# Patient Record
Sex: Female | Born: 1975 | Race: White | Hispanic: No | Marital: Single | Smoking: Former smoker
Health system: Southern US, Community
[De-identification: ages and names within clinical notes are randomized; demographics above are authoritative.]

## PROBLEM LIST (undated history)

## (undated) DIAGNOSIS — M65331 Trigger finger, right middle finger: Secondary | ICD-10-CM

## (undated) DIAGNOSIS — Z9889 Other specified postprocedural states: Secondary | ICD-10-CM

## (undated) DIAGNOSIS — Z8679 Personal history of other diseases of the circulatory system: Secondary | ICD-10-CM

## (undated) DIAGNOSIS — F909 Attention-deficit hyperactivity disorder, unspecified type: Secondary | ICD-10-CM

## (undated) DIAGNOSIS — R112 Nausea with vomiting, unspecified: Secondary | ICD-10-CM

## (undated) DIAGNOSIS — Z8614 Personal history of Methicillin resistant Staphylococcus aureus infection: Secondary | ICD-10-CM

## (undated) HISTORY — PX: BREAST REDUCTION SURGERY: SHX8

## (undated) HISTORY — PX: REFRACTIVE SURGERY: SHX103

---

## 1999-12-21 DIAGNOSIS — Z8679 Personal history of other diseases of the circulatory system: Secondary | ICD-10-CM

## 1999-12-21 HISTORY — PX: HEMATOMA EVACUATION: SHX5118

## 1999-12-21 HISTORY — DX: Personal history of other diseases of the circulatory system: Z86.79

## 2004-08-27 ENCOUNTER — Ambulatory Visit: Payer: Self-pay | Admitting: *Deleted

## 2004-08-27 ENCOUNTER — Ambulatory Visit: Payer: Self-pay | Admitting: Internal Medicine

## 2004-09-07 ENCOUNTER — Ambulatory Visit: Payer: Self-pay | Admitting: *Deleted

## 2005-11-05 ENCOUNTER — Ambulatory Visit: Payer: Self-pay | Admitting: Internal Medicine

## 2005-11-29 ENCOUNTER — Ambulatory Visit: Payer: Self-pay | Admitting: Internal Medicine

## 2006-01-28 ENCOUNTER — Ambulatory Visit: Payer: Self-pay | Admitting: Internal Medicine

## 2006-04-21 ENCOUNTER — Ambulatory Visit: Payer: Self-pay | Admitting: Internal Medicine

## 2006-11-25 ENCOUNTER — Ambulatory Visit: Payer: Self-pay | Admitting: Internal Medicine

## 2007-01-31 ENCOUNTER — Ambulatory Visit: Payer: Self-pay | Admitting: Internal Medicine

## 2007-02-14 ENCOUNTER — Encounter: Payer: Self-pay | Admitting: Internal Medicine

## 2007-02-14 ENCOUNTER — Ambulatory Visit: Payer: Self-pay | Admitting: Internal Medicine

## 2007-05-04 ENCOUNTER — Ambulatory Visit: Payer: Self-pay | Admitting: Internal Medicine

## 2007-08-25 ENCOUNTER — Ambulatory Visit: Payer: Self-pay | Admitting: Internal Medicine

## 2007-09-06 ENCOUNTER — Encounter (INDEPENDENT_AMBULATORY_CARE_PROVIDER_SITE_OTHER): Payer: Self-pay | Admitting: *Deleted

## 2008-01-16 ENCOUNTER — Ambulatory Visit: Payer: Self-pay | Admitting: Family Medicine

## 2008-01-16 DIAGNOSIS — I621 Nontraumatic extradural hemorrhage: Secondary | ICD-10-CM

## 2008-01-16 DIAGNOSIS — L732 Hidradenitis suppurativa: Secondary | ICD-10-CM

## 2008-01-16 DIAGNOSIS — B009 Herpesviral infection, unspecified: Secondary | ICD-10-CM | POA: Insufficient documentation

## 2008-01-16 DIAGNOSIS — Z9189 Other specified personal risk factors, not elsewhere classified: Secondary | ICD-10-CM | POA: Insufficient documentation

## 2008-02-01 ENCOUNTER — Telehealth (INDEPENDENT_AMBULATORY_CARE_PROVIDER_SITE_OTHER): Payer: Self-pay | Admitting: *Deleted

## 2008-03-07 ENCOUNTER — Encounter: Payer: Self-pay | Admitting: Family Medicine

## 2008-04-01 ENCOUNTER — Telehealth (INDEPENDENT_AMBULATORY_CARE_PROVIDER_SITE_OTHER): Payer: Self-pay | Admitting: *Deleted

## 2008-04-23 ENCOUNTER — Encounter: Payer: Self-pay | Admitting: Family Medicine

## 2008-05-24 ENCOUNTER — Telehealth (INDEPENDENT_AMBULATORY_CARE_PROVIDER_SITE_OTHER): Payer: Self-pay | Admitting: *Deleted

## 2008-06-11 ENCOUNTER — Telehealth (INDEPENDENT_AMBULATORY_CARE_PROVIDER_SITE_OTHER): Payer: Self-pay | Admitting: *Deleted

## 2008-06-12 ENCOUNTER — Ambulatory Visit: Payer: Self-pay | Admitting: Internal Medicine

## 2008-06-12 DIAGNOSIS — R071 Chest pain on breathing: Secondary | ICD-10-CM | POA: Insufficient documentation

## 2008-06-13 ENCOUNTER — Encounter (INDEPENDENT_AMBULATORY_CARE_PROVIDER_SITE_OTHER): Payer: Self-pay | Admitting: *Deleted

## 2008-06-24 ENCOUNTER — Telehealth (INDEPENDENT_AMBULATORY_CARE_PROVIDER_SITE_OTHER): Payer: Self-pay | Admitting: *Deleted

## 2008-07-18 ENCOUNTER — Telehealth (INDEPENDENT_AMBULATORY_CARE_PROVIDER_SITE_OTHER): Payer: Self-pay | Admitting: *Deleted

## 2008-08-13 ENCOUNTER — Ambulatory Visit: Payer: Self-pay | Admitting: Family Medicine

## 2008-08-13 DIAGNOSIS — H9190 Unspecified hearing loss, unspecified ear: Secondary | ICD-10-CM | POA: Insufficient documentation

## 2008-08-13 DIAGNOSIS — I8390 Asymptomatic varicose veins of unspecified lower extremity: Secondary | ICD-10-CM

## 2008-08-23 ENCOUNTER — Ambulatory Visit (HOSPITAL_COMMUNITY): Admission: RE | Admit: 2008-08-23 | Discharge: 2008-08-23 | Payer: Self-pay | Admitting: Family Medicine

## 2008-08-23 ENCOUNTER — Encounter: Payer: Self-pay | Admitting: Family Medicine

## 2008-08-30 ENCOUNTER — Telehealth (INDEPENDENT_AMBULATORY_CARE_PROVIDER_SITE_OTHER): Payer: Self-pay | Admitting: *Deleted

## 2008-09-12 ENCOUNTER — Encounter: Payer: Self-pay | Admitting: Family Medicine

## 2008-09-12 ENCOUNTER — Ambulatory Visit: Payer: Self-pay | Admitting: Family Medicine

## 2008-09-12 ENCOUNTER — Telehealth (INDEPENDENT_AMBULATORY_CARE_PROVIDER_SITE_OTHER): Payer: Self-pay | Admitting: *Deleted

## 2008-09-12 ENCOUNTER — Other Ambulatory Visit: Admission: RE | Admit: 2008-09-12 | Discharge: 2008-09-12 | Payer: Self-pay | Admitting: Family Medicine

## 2008-09-12 DIAGNOSIS — L089 Local infection of the skin and subcutaneous tissue, unspecified: Secondary | ICD-10-CM | POA: Insufficient documentation

## 2008-09-12 DIAGNOSIS — S90859A Superficial foreign body, unspecified foot, initial encounter: Secondary | ICD-10-CM

## 2008-09-12 LAB — HM PAP SMEAR

## 2008-09-18 ENCOUNTER — Encounter (INDEPENDENT_AMBULATORY_CARE_PROVIDER_SITE_OTHER): Payer: Self-pay | Admitting: *Deleted

## 2008-09-23 ENCOUNTER — Ambulatory Visit: Payer: Self-pay | Admitting: Vascular Surgery

## 2008-09-23 LAB — CONVERTED CEMR LAB
Albumin: 3.9 g/dL (ref 3.5–5.2)
BUN: 12 mg/dL (ref 6–23)
Basophils Absolute: 0 10*3/uL (ref 0.0–0.1)
Bilirubin, Direct: 0.2 mg/dL (ref 0.0–0.3)
Calcium: 9.6 mg/dL (ref 8.4–10.5)
Eosinophils Absolute: 0.3 10*3/uL (ref 0.0–0.7)
Eosinophils Relative: 3.9 % (ref 0.0–5.0)
HCT: 42.2 % (ref 36.0–46.0)
Hemoglobin: 14.3 g/dL (ref 12.0–15.0)
Monocytes Absolute: 0.8 10*3/uL (ref 0.1–1.0)
Monocytes Relative: 12.4 % — ABNORMAL HIGH (ref 3.0–12.0)
Neutro Abs: 4.1 10*3/uL (ref 1.4–7.7)
Neutrophils Relative %: 60.1 % (ref 43.0–77.0)
Platelets: 262 10*3/uL (ref 150–400)
Potassium: 4.1 meq/L (ref 3.5–5.1)
Sodium: 142 meq/L (ref 135–145)
Total CHOL/HDL Ratio: 2.6
Total Protein: 6.7 g/dL (ref 6.0–8.3)
VLDL: 8 mg/dL (ref 0–40)

## 2008-09-27 ENCOUNTER — Telehealth (INDEPENDENT_AMBULATORY_CARE_PROVIDER_SITE_OTHER): Payer: Self-pay | Admitting: *Deleted

## 2008-09-27 DIAGNOSIS — R87619 Unspecified abnormal cytological findings in specimens from cervix uteri: Secondary | ICD-10-CM

## 2008-10-01 ENCOUNTER — Encounter (INDEPENDENT_AMBULATORY_CARE_PROVIDER_SITE_OTHER): Payer: Self-pay | Admitting: *Deleted

## 2008-10-02 ENCOUNTER — Telehealth (INDEPENDENT_AMBULATORY_CARE_PROVIDER_SITE_OTHER): Payer: Self-pay | Admitting: *Deleted

## 2008-10-07 ENCOUNTER — Ambulatory Visit: Payer: Self-pay | Admitting: Family Medicine

## 2008-10-07 LAB — CONVERTED CEMR LAB: Beta hcg, urine, semiquantitative: NEGATIVE

## 2008-10-21 ENCOUNTER — Telehealth (INDEPENDENT_AMBULATORY_CARE_PROVIDER_SITE_OTHER): Payer: Self-pay | Admitting: *Deleted

## 2008-10-22 ENCOUNTER — Telehealth (INDEPENDENT_AMBULATORY_CARE_PROVIDER_SITE_OTHER): Payer: Self-pay | Admitting: *Deleted

## 2008-10-28 ENCOUNTER — Ambulatory Visit: Payer: Self-pay | Admitting: Vascular Surgery

## 2008-10-30 ENCOUNTER — Telehealth (INDEPENDENT_AMBULATORY_CARE_PROVIDER_SITE_OTHER): Payer: Self-pay | Admitting: *Deleted

## 2008-11-18 ENCOUNTER — Ambulatory Visit: Payer: Self-pay | Admitting: Vascular Surgery

## 2008-11-21 ENCOUNTER — Telehealth: Payer: Self-pay | Admitting: Family Medicine

## 2008-12-04 ENCOUNTER — Encounter: Payer: Self-pay | Admitting: Family Medicine

## 2008-12-17 ENCOUNTER — Telehealth (INDEPENDENT_AMBULATORY_CARE_PROVIDER_SITE_OTHER): Payer: Self-pay | Admitting: *Deleted

## 2009-01-06 ENCOUNTER — Ambulatory Visit: Payer: Self-pay | Admitting: Vascular Surgery

## 2009-01-06 HISTORY — PX: VARICOSE VEIN SURGERY: SHX832

## 2009-01-13 ENCOUNTER — Ambulatory Visit: Payer: Self-pay | Admitting: Vascular Surgery

## 2009-02-12 ENCOUNTER — Ambulatory Visit: Payer: Self-pay | Admitting: Family Medicine

## 2009-02-12 DIAGNOSIS — F411 Generalized anxiety disorder: Secondary | ICD-10-CM | POA: Insufficient documentation

## 2009-02-12 DIAGNOSIS — L708 Other acne: Secondary | ICD-10-CM

## 2009-02-12 DIAGNOSIS — F988 Other specified behavioral and emotional disorders with onset usually occurring in childhood and adolescence: Secondary | ICD-10-CM | POA: Insufficient documentation

## 2009-03-05 ENCOUNTER — Ambulatory Visit: Payer: Self-pay | Admitting: *Deleted

## 2009-03-12 ENCOUNTER — Encounter: Payer: Self-pay | Admitting: Family Medicine

## 2009-03-14 ENCOUNTER — Ambulatory Visit: Payer: Self-pay | Admitting: *Deleted

## 2009-03-25 ENCOUNTER — Telehealth (INDEPENDENT_AMBULATORY_CARE_PROVIDER_SITE_OTHER): Payer: Self-pay | Admitting: *Deleted

## 2009-03-28 ENCOUNTER — Telehealth (INDEPENDENT_AMBULATORY_CARE_PROVIDER_SITE_OTHER): Payer: Self-pay | Admitting: *Deleted

## 2009-04-03 ENCOUNTER — Telehealth (INDEPENDENT_AMBULATORY_CARE_PROVIDER_SITE_OTHER): Payer: Self-pay | Admitting: *Deleted

## 2009-05-22 ENCOUNTER — Telehealth (INDEPENDENT_AMBULATORY_CARE_PROVIDER_SITE_OTHER): Payer: Self-pay | Admitting: *Deleted

## 2009-05-29 ENCOUNTER — Telehealth (INDEPENDENT_AMBULATORY_CARE_PROVIDER_SITE_OTHER): Payer: Self-pay | Admitting: *Deleted

## 2009-07-01 ENCOUNTER — Telehealth (INDEPENDENT_AMBULATORY_CARE_PROVIDER_SITE_OTHER): Payer: Self-pay | Admitting: *Deleted

## 2009-08-27 ENCOUNTER — Telehealth (INDEPENDENT_AMBULATORY_CARE_PROVIDER_SITE_OTHER): Payer: Self-pay | Admitting: *Deleted

## 2009-09-15 ENCOUNTER — Ambulatory Visit: Payer: Self-pay | Admitting: Family Medicine

## 2009-09-16 ENCOUNTER — Ambulatory Visit: Payer: Self-pay | Admitting: Family Medicine

## 2009-09-17 ENCOUNTER — Encounter: Payer: Self-pay | Admitting: Family Medicine

## 2009-09-17 LAB — CONVERTED CEMR LAB
ALT: 14 units/L (ref 0–35)
AST: 19 units/L (ref 0–37)
Albumin: 3.5 g/dL (ref 3.5–5.2)
Alkaline Phosphatase: 43 units/L (ref 39–117)
Calcium: 8.7 mg/dL (ref 8.4–10.5)
Chloride: 111 meq/L (ref 96–112)
Cholesterol: 137 mg/dL (ref 0–200)
Creatinine, Ser: 0.8 mg/dL (ref 0.4–1.2)
Glucose, Bld: 96 mg/dL (ref 70–99)
HCT: 39.8 % (ref 36.0–46.0)
Lymphs Abs: 1.5 10*3/uL (ref 0.7–4.0)
MCV: 86.8 fL (ref 78.0–100.0)
Monocytes Absolute: 0.6 10*3/uL (ref 0.1–1.0)
Monocytes Relative: 12.5 % — ABNORMAL HIGH (ref 3.0–12.0)
Neutro Abs: 2.9 10*3/uL (ref 1.4–7.7)
Platelets: 248 10*3/uL (ref 150.0–400.0)
RDW: 11.4 % — ABNORMAL LOW (ref 11.5–14.6)
TSH: 1.38 microintl units/mL (ref 0.35–5.50)
Total CHOL/HDL Ratio: 3
Triglycerides: 90 mg/dL (ref 0.0–149.0)
WBC: 5.1 10*3/uL (ref 4.5–10.5)

## 2009-10-15 ENCOUNTER — Telehealth: Payer: Self-pay | Admitting: Family Medicine

## 2009-11-17 ENCOUNTER — Telehealth (INDEPENDENT_AMBULATORY_CARE_PROVIDER_SITE_OTHER): Payer: Self-pay | Admitting: *Deleted

## 2009-12-17 ENCOUNTER — Telehealth (INDEPENDENT_AMBULATORY_CARE_PROVIDER_SITE_OTHER): Payer: Self-pay | Admitting: *Deleted

## 2009-12-24 IMAGING — CR DG CHEST 2V
2 series · 2 of 2 positions shown · non-contrast
Comparison: None

CLINICAL DATA: Acute chest wall pain.

CHEST - 2 VIEW

[view not recorded (1 of 2)]
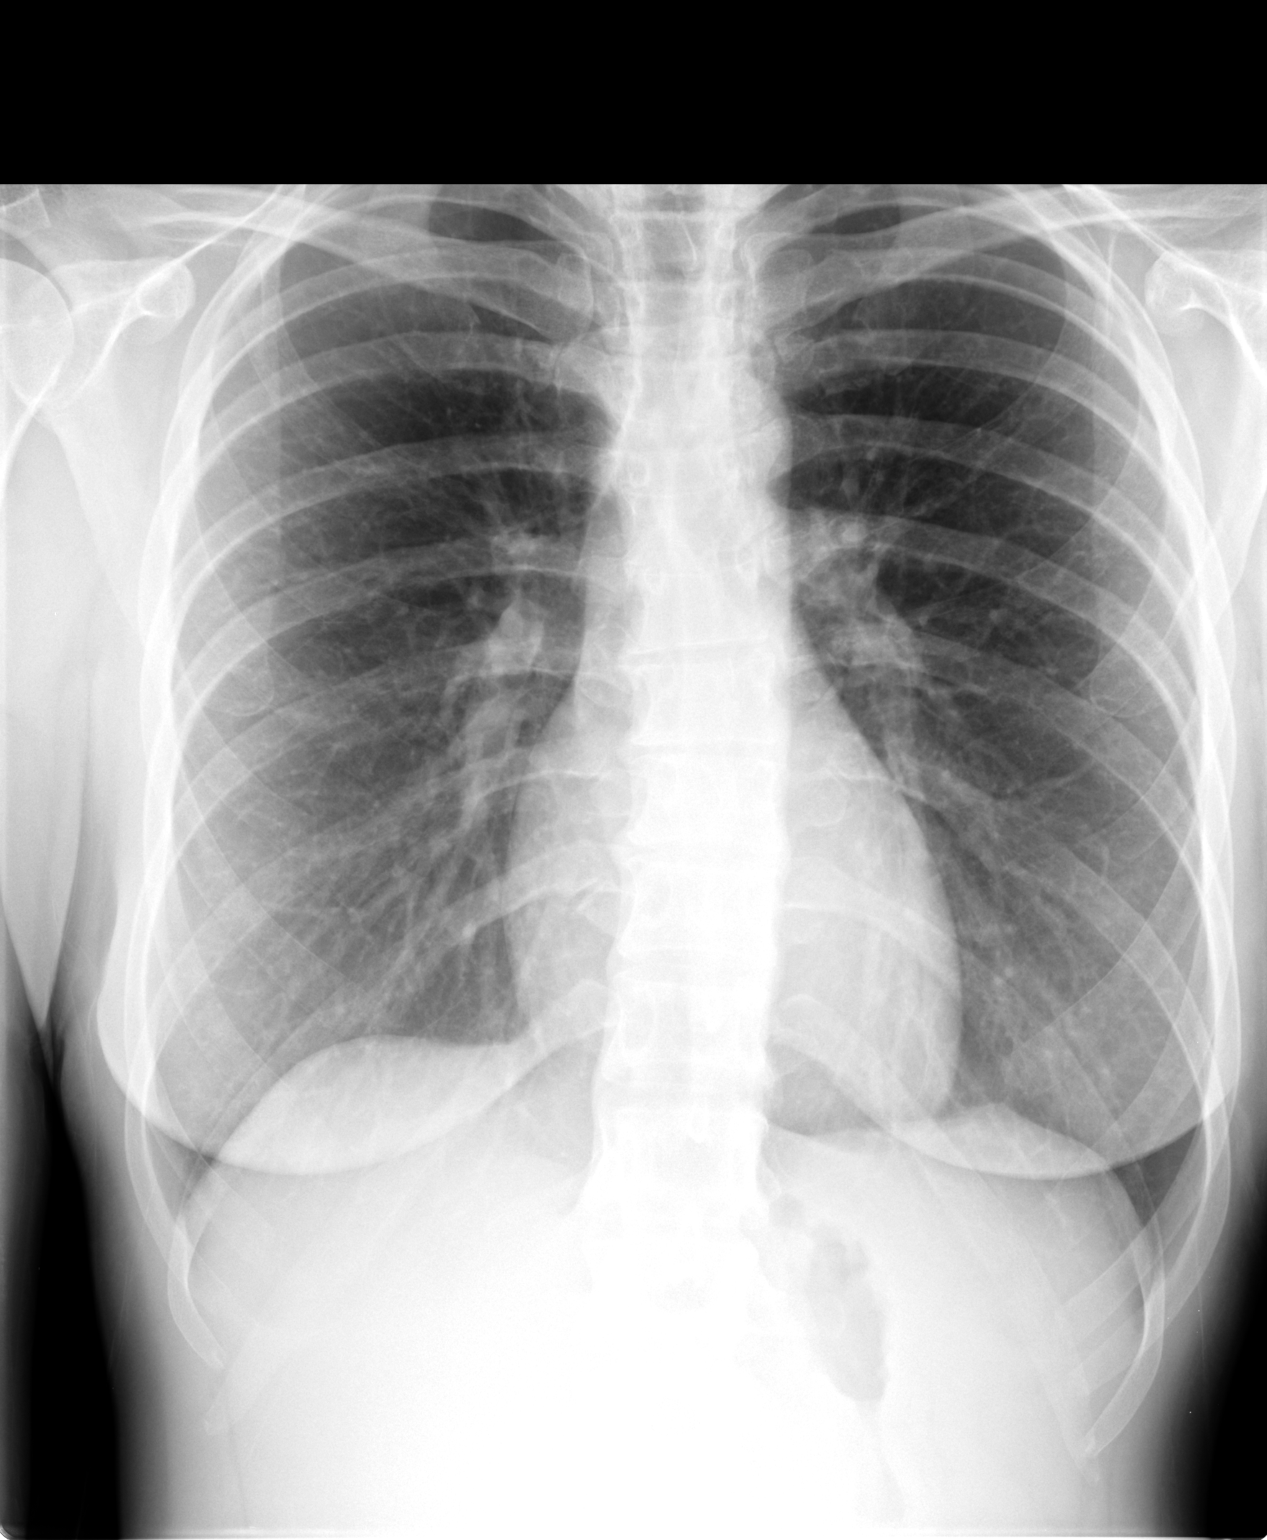

[view not recorded (2 of 2)]
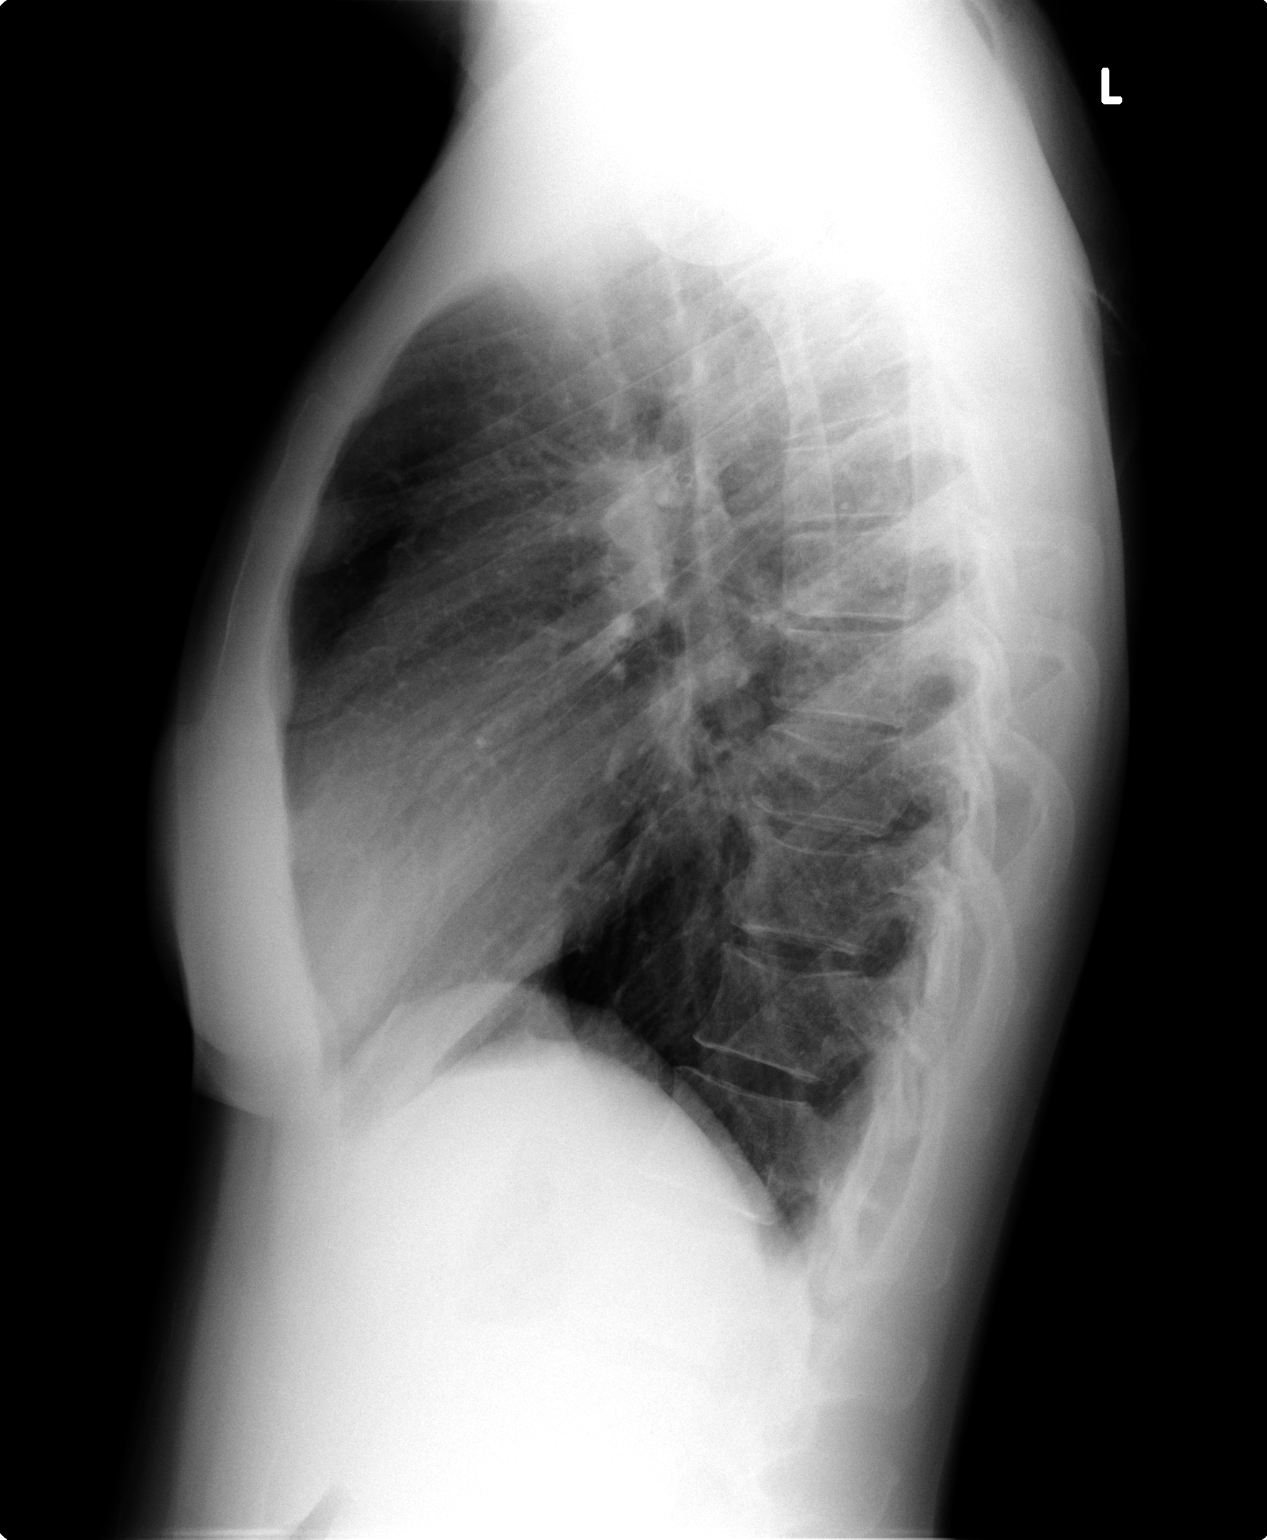

[2 of 2 positions shown; findings below may reference images not displayed]

FINDINGS: The cardiac silhouette, mediastinal and hilar contours
are within normal limits.  The lungs are clear.  The bony thorax is
intact.
IMPRESSION: 1.  No acute cardiopulmonary findings.

## 2009-12-26 ENCOUNTER — Ambulatory Visit (HOSPITAL_BASED_OUTPATIENT_CLINIC_OR_DEPARTMENT_OTHER): Admission: RE | Admit: 2009-12-26 | Discharge: 2009-12-26 | Payer: Self-pay | Admitting: Orthopedic Surgery

## 2009-12-26 HISTORY — PX: CARPAL TUNNEL RELEASE: SHX101

## 2010-01-23 ENCOUNTER — Telehealth (INDEPENDENT_AMBULATORY_CARE_PROVIDER_SITE_OTHER): Payer: Self-pay | Admitting: *Deleted

## 2010-02-20 ENCOUNTER — Telehealth (INDEPENDENT_AMBULATORY_CARE_PROVIDER_SITE_OTHER): Payer: Self-pay | Admitting: *Deleted

## 2010-03-24 ENCOUNTER — Telehealth (INDEPENDENT_AMBULATORY_CARE_PROVIDER_SITE_OTHER): Payer: Self-pay | Admitting: *Deleted

## 2010-04-27 ENCOUNTER — Ambulatory Visit: Payer: Self-pay | Admitting: Family Medicine

## 2010-06-05 ENCOUNTER — Telehealth (INDEPENDENT_AMBULATORY_CARE_PROVIDER_SITE_OTHER): Payer: Self-pay | Admitting: *Deleted

## 2010-06-30 ENCOUNTER — Ambulatory Visit: Payer: Self-pay | Admitting: Family Medicine

## 2010-06-30 DIAGNOSIS — R519 Headache, unspecified: Secondary | ICD-10-CM | POA: Insufficient documentation

## 2010-06-30 DIAGNOSIS — G2581 Restless legs syndrome: Secondary | ICD-10-CM

## 2010-06-30 DIAGNOSIS — R51 Headache: Secondary | ICD-10-CM

## 2010-07-13 ENCOUNTER — Telehealth (INDEPENDENT_AMBULATORY_CARE_PROVIDER_SITE_OTHER): Payer: Self-pay | Admitting: *Deleted

## 2010-09-14 ENCOUNTER — Telehealth (INDEPENDENT_AMBULATORY_CARE_PROVIDER_SITE_OTHER): Payer: Self-pay | Admitting: *Deleted

## 2010-09-30 ENCOUNTER — Ambulatory Visit: Payer: Self-pay | Admitting: Family Medicine

## 2010-09-30 LAB — CONVERTED CEMR LAB
Glucose, Urine, Semiquant: NEGATIVE
Ketones, urine, test strip: NEGATIVE
Specific Gravity, Urine: 1.01

## 2010-10-07 LAB — CONVERTED CEMR LAB
Albumin: 3.9 g/dL (ref 3.5–5.2)
Alkaline Phosphatase: 57 units/L (ref 39–117)
BUN: 11 mg/dL (ref 6–23)
Basophils Absolute: 0 10*3/uL (ref 0.0–0.1)
Bilirubin, Direct: 0.1 mg/dL (ref 0.0–0.3)
Calcium: 9.7 mg/dL (ref 8.4–10.5)
Creatinine, Ser: 0.8 mg/dL (ref 0.4–1.2)
Eosinophils Absolute: 0 10*3/uL (ref 0.0–0.7)
GFR calc non Af Amer: 83.61 mL/min (ref >60)
HCT: 42.1 % (ref 36.0–46.0)
HDL: 53.9 mg/dL (ref >39.00)
Hemoglobin: 14.1 g/dL (ref 12.0–15.0)
MCV: 85.6 fL (ref 78.0–100.0)
Monocytes Absolute: 0.6 10*3/uL (ref 0.1–1.0)
Monocytes Relative: 6.6 % (ref 3.0–12.0)
Neutrophils Relative %: 74.1 % (ref 43.0–77.0)
Total Bilirubin: 0.9 mg/dL (ref 0.3–1.2)
Total Protein: 6.7 g/dL (ref 6.0–8.3)
VLDL: 24.6 mg/dL (ref 0.0–40.0)

## 2010-10-20 ENCOUNTER — Telehealth: Payer: Self-pay | Admitting: Family Medicine

## 2010-10-22 ENCOUNTER — Telehealth (INDEPENDENT_AMBULATORY_CARE_PROVIDER_SITE_OTHER): Payer: Self-pay | Admitting: *Deleted

## 2010-12-10 ENCOUNTER — Ambulatory Visit: Payer: Self-pay | Admitting: Internal Medicine

## 2010-12-10 ENCOUNTER — Encounter (INDEPENDENT_AMBULATORY_CARE_PROVIDER_SITE_OTHER): Payer: Self-pay | Admitting: *Deleted

## 2010-12-10 DIAGNOSIS — J069 Acute upper respiratory infection, unspecified: Secondary | ICD-10-CM | POA: Insufficient documentation

## 2010-12-10 LAB — CONVERTED CEMR LAB: Rapid Strep: NEGATIVE

## 2010-12-17 ENCOUNTER — Telehealth: Payer: Self-pay | Admitting: Family Medicine

## 2010-12-20 DIAGNOSIS — Z8614 Personal history of Methicillin resistant Staphylococcus aureus infection: Secondary | ICD-10-CM

## 2010-12-20 HISTORY — DX: Personal history of Methicillin resistant Staphylococcus aureus infection: Z86.14

## 2010-12-24 ENCOUNTER — Telehealth: Payer: Self-pay | Admitting: Family Medicine

## 2011-01-08 ENCOUNTER — Ambulatory Visit: Admit: 2011-01-08 | Payer: Self-pay | Admitting: Family Medicine

## 2011-01-10 ENCOUNTER — Encounter: Payer: Self-pay | Admitting: Family Medicine

## 2011-01-17 LAB — CONVERTED CEMR LAB: Beta hcg, urine, semiquantitative: NEGATIVE

## 2011-01-20 NOTE — Progress Notes (Signed)
  Phone Note Refill Request   Refills Requested: Medication #1:  ADDERALL 20 MG  TABS bid  Follow-up for Phone Call        Pt is aware that rx is ready. Army Fossa CMA  January 23, 2010 10:14 AM     Prescriptions: ADDERALL 20 MG  TABS (AMPHETAMINE-DEXTROAMPHETAMINE) bid  #60 x 0   Entered by:   Army Fossa CMA   Authorized by:   Loreen Freud DO   Signed by:   Army Fossa CMA on 01/23/2010   Method used:   Print then Give to Patient   RxID:   [id number]

## 2011-01-20 NOTE — Assessment & Plan Note (Signed)
Summary: CPX AND PAP/CDJ   Vital Signs:  Patient profile:   35 year old female Height:      72 inches Weight:      172.6 pounds Temp:     98.8 degrees F oral Pulse rate:   72 / minute Pulse rhythm:   regular BP sitting:   116 / 76  (left arm) Cuff size:   regular  Vitals Entered By: Almeta Monas CMA Duncan Dull) (September 30, 2010 10:31 AM) CC: cpx/fasting no pap needed   History of Present Illness: Pt here for cpe ---no pap--- pt sees gyn.   No complaints.   Pt still having trouble with grinding teeth.  She is still having headaches but they are better.---pt will discuss with gyn because they come with cyle and 2 weeks after.     Preventive Screening-Counseling & Management  Alcohol-Tobacco     Alcohol drinks/day: <1     Smoking Status: never     Passive Smoke Exposure: no  Caffeine-Diet-Exercise     Caffeine use/day: 2     Does Patient Exercise: yes     Type of exercise: dancer, walking     Times/week: 4  Hep-HIV-STD-Contraception     Dental Visit-last 6 months yes     Dental Care Counseling: not indicated; dental care within six months     SBE monthly: yes  Safety-Violence-Falls     Seat Belt Use: 100      Sexual History:  currently monogamous.    Current Medications (verified): 1)  Acyclovir 200 Mg Caps (Acyclovir) .... Take 2 Capsules By Mouth Each Day For Herpes Prevention 2)  Adderall 20 Mg  Tabs (Amphetamine-Dextroamphetamine) .... Bid 3)  Clenia Foaming Wash 10-5 %  Emul (Sulfacetamide Sodium-Sulfur) .... Use Three Times Daily. 4)  Ortho Tri-Cyclen (28) 0.035 Mg Tabs (Norgestimate-Ethinyl Estradiol) .... As Directed 5)  Clenia 10-5 % Crea (Sulfacetamide Sodium-Sulfur) .... Use Three Times Daily 6)  Soma 250 Mg Tabs (Carisoprodol) .Marland Kitchen.. 1 By Mouth At Bedtime As Needed  Allergies (verified): 1)  ! Codeine  Past History:  Past Medical History: Last updated: 06/12/2008 G0 epidural hematoma post fall from balcony  Family History: Last updated:  01/16/2008 CAD - no HTN - F. family DM - F family stroke - no colon ca - no breast ca - no  Social History: Last updated: 01/16/2008 Single Never Smoked Alcohol use-no advanced home care-- resp care  Risk Factors: Alcohol Use: <1 (09/30/2010) Caffeine Use: 2 (09/30/2010) Exercise: yes (09/30/2010)  Risk Factors: Smoking Status: never (09/30/2010) Passive Smoke Exposure: no (09/30/2010)  Past Surgical History: breast reduction ; TM repair post perforation lasik surgery Carpal tunnel release (12/2009) Right--sypher  Family History: Reviewed history from 01/16/2008 and no changes required. CAD - no HTN - F. family DM - F family stroke - no colon ca - no breast ca - no  Social History: Reviewed history from 01/16/2008 and no changes required. Single Never Smoked Alcohol use-no advanced home care-- resp careSexual History:  currently monogamous  Review of Systems      See HPI General:  Denies chills, fatigue, fever, loss of appetite, malaise, sleep disorder, sweats, weakness, and weight loss. Eyes:  Denies blurring, discharge, double vision, eye irritation, eye pain, halos, itching, light sensitivity, red eye, vision loss-1 eye, and vision loss-both eyes; optho-q1y. ENT:  Denies decreased hearing, difficulty swallowing, ear discharge, earache, hoarseness, nasal congestion, nosebleeds, postnasal drainage, ringing in ears, sinus pressure, and sore throat. CV:  Denies bluish discoloration of lips  or nails, chest pain or discomfort, difficulty breathing at night, difficulty breathing while lying down, fainting, fatigue, leg cramps with exertion, lightheadness, near fainting, palpitations, shortness of breath with exertion, swelling of feet, swelling of hands, and weight gain. Resp:  Denies chest discomfort, chest pain with inspiration, cough, coughing up blood, excessive snoring, hypersomnolence, morning headaches, pleuritic, shortness of breath, sputum productive, and  wheezing. GI:  Denies abdominal pain, bloody stools, change in bowel habits, constipation, dark tarry stools, diarrhea, excessive appetite, gas, hemorrhoids, indigestion, loss of appetite, nausea, vomiting, vomiting blood, and yellowish skin color. GU:  Denies abnormal vaginal bleeding, decreased libido, discharge, dysuria, genital sores, hematuria, incontinence, nocturia, urinary frequency, and urinary hesitancy. MS:  Denies joint pain, joint redness, joint swelling, loss of strength, low back pain, mid back pain, muscle aches, muscle , cramps, muscle weakness, stiffness, and thoracic pain. Derm:  Denies changes in color of skin, changes in nail beds, dryness, excessive perspiration, flushing, hair loss, insect bite(s), itching, lesion(s), poor wound healing, and rash. Neuro:  Denies brief paralysis, difficulty with concentration, disturbances in coordination, falling down, headaches, inability to speak, memory loss, numbness, poor balance, seizures, sensation of room spinning, tingling, tremors, visual disturbances, and weakness. Psych:  Denies alternate hallucination ( auditory/visual), anxiety, depression, easily angered, easily tearful, irritability, mental problems, panic attacks, sense of great danger, suicidal thoughts/plans, thoughts of violence, unusual visions or sounds, and thoughts /plans of harming others. Endo:  Denies cold intolerance, excessive hunger, excessive thirst, excessive urination, heat intolerance, polyuria, and weight change. Heme:  Denies abnormal bruising, bleeding, enlarge lymph nodes, fevers, pallor, and skin discoloration. Allergy:  Denies hives or rash, itching eyes, persistent infections, seasonal allergies, and sneezing.  Physical Exam  General:  Well-developed,well-nourished,in no acute distress; alert,appropriate and cooperative throughout examination Head:  Normocephalic and atraumatic without obvious abnormalities. No apparent alopecia or balding. Eyes:  vision  grossly intact, pupils equal, pupils round, pupils reactive to light, and no injection.   Ears:  External ear exam shows no significant lesions or deformities.  Otoscopic examination reveals clear canals, tympanic membranes are intact bilaterally without bulging, retraction, inflammation or discharge. Hearing is grossly normal bilaterally. Nose:  External nasal examination shows no deformity or inflammation. Nasal mucosa are pink and moist without lesions or exudates. Mouth:  Oral mucosa and oropharynx without lesions or exudates.  Teeth in good repair. Neck:  No deformities, masses, or tenderness noted.no carotid bruits.   Breasts:  gyn Lungs:  Normal respiratory effort, chest expands symmetrically. Lungs are clear to auscultation, no crackles or wheezes. Heart:  normal rate and no murmur.   Abdomen:  Bowel sounds positive,abdomen soft and non-tender without masses, organomegaly or hernias noted. Genitalia:  gyn Msk:  normal ROM, no joint tenderness, no joint swelling, no joint warmth, no redness over joints, no joint deformities, no joint instability, and no crepitation.   Pulses:  R and L carotid,radial,femoral,dorsalis pedis and posterior tibial pulses are full and equal bilaterally Extremities:  No clubbing, cyanosis, edema, or deformity noted with normal full range of motion of all joints.   Neurologic:  No cranial nerve deficits noted. Station and gait are normal. Plantar reflexes are down-going bilaterally. DTRs are symmetrical throughout. Sensory, motor and coordinative functions appear intact. Skin:  Intact without suspicious lesions or rashes Cervical Nodes:  No lymphadenopathy noted Axillary Nodes:  No palpable lymphadenopathy Psych:  Cognition and judgment appear intact. Alert and cooperative with normal attention span and concentration. 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 Take 2 capsules by mouth each day for herpes prevention 2)  Adderall 20 Mg Tabs (Amphetamine-dextroamphetamine) .... Bid 3)  Clenia Foaming Wash 10-5 % Emul (Sulfacetamide sodium-sulfur) .... Use three times daily. 4)  Ortho Tri-cyclen (28) 0.035 Mg Tabs (Norgestimate-ethinyl estradiol) .... As directed 5)  Clenia 10-5 % Crea (Sulfacetamide sodium-sulfur) .... Use three times daily 6)  Soma 250 Mg Tabs (Carisoprodol) .Marland Kitchen.. 1 by mouth at bedtime as needed  Other Orders: Admin 1st Vaccine (91478) Flu Vaccine 84yrs + (29562) Flu Vaccine Consent Questions     Do you have a history of severe allergic reactions to this vaccine? no    Any prior history of allergic reactions to egg and/or gelatin? no    Do you have a sensitivity to the preservative Thimersol? no    Do you have a past history of Guillan-Barre Syndrome? no    Do you currently have an acute febrile illness? no    Have you ever had a severe reaction to latex? no    Vaccine information given and explained to patient? yes    Are you currently pregnant? no    Lot Number:[id number]   Exp Date:06/19/2011   Site Given  Left Deltoid IM  Other Orders: Admin 1st Vaccine (13086) Flu Vaccine 28yrs + (57846) Prescriptions: SOMA 250 MG TABS (CARISOPRODOL) 1 by mouth at bedtime as needed  #30 x 0   Entered and Authorized by:   Loreen Freud DO   Signed by:   Loreen Freud DO on 09/30/2010   Method used:   Electronically to        [facility] [address] (retail)       [phone]       [place], [place]  [address]       Ph: [phone]       Fax: [phone]   RxID:   [id number]  .lbflu  Last Flu Vaccine:  Fluvax Non-MCR (09/15/2009 8:24:12 AM) Flu Vaccine Result Date:  09/30/2010 Flu Vaccine Result:  given Flu Vaccine Next Due:  1 yr  Laboratory Results   Urine Tests   Date/Time Reported: September 30, 2010 11:42 AM   Routine Urinalysis   Color: yellow Appearance: Clear Glucose: negative   (Normal Range: Negative) Bilirubin: negative   (Normal Range: Negative) Ketone: negative   (Normal Range: Negative) Spec. Gravity: 1.010   (Normal Range: 1.003-1.035) Blood: negative   (Normal Range: Negative) pH: 5.0   (Normal Range: 5.0-8.0) Protein: negative   (Normal Range: Negative) Urobilinogen: negative   (Normal Range: 0-1) Nitrite: negative   (Normal Range: Negative) Leukocyte Esterace: negative   (Normal Range: Negative)    Comments: Floydene Flock  September 30, 2010 11:43 AM

## 2011-01-20 NOTE — Progress Notes (Signed)
Summary: adderall refill   Phone Note Refill Request Call back at Home Phone [phone]   Refills Requested: Medication #1:  ADDERALL 20 MG  TABS bid Initial call taken by: Doristine Devoid CMA,  September 14, 2010 2:05 PM  Follow-up for Phone Call        left message on machine prescription ready for pick up....Marland KitchenMarland KitchenDoristine Devoid CMA  September 14, 2010 2:07 PM     Prescriptions: ADDERALL 20 MG  TABS (AMPHETAMINE-DEXTROAMPHETAMINE) bid  #60 x 0   Entered by:   Doristine Devoid CMA   Authorized by:   Loreen Freud DO   Signed by:   Doristine Devoid CMA on 09/14/2010   Method used:   Print then Give to Patient   RxID:   [id number]

## 2011-01-20 NOTE — Progress Notes (Signed)
Summary: Refill Request(Non-Urgent)  Phone Note Refill Request Call back at Home Phone [phone] Message from:  Patient  Refills Requested: Medication #1:  ADDERALL 20 MG  TABS bid  Method Requested: Pick up at Office Initial call taken by: Shonna Chock,  June 05, 2010 3:32 PM  Follow-up for Phone Call        Pt is aware rx is ready. Army Fossa CMA  June 05, 2010 3:35 PM     Prescriptions: ADDERALL 20 MG  TABS (AMPHETAMINE-DEXTROAMPHETAMINE) bid  #60 x 0   Entered by:   Army Fossa CMA   Authorized by:   Loreen Freud DO   Signed by:   Army Fossa CMA on 06/05/2010   Method used:   Print then Give to Patient   RxID:   [id number]

## 2011-01-20 NOTE — Progress Notes (Signed)
  Phone Note Refill Request   Refills Requested: Medication #1:  ADDERALL 20 MG  TABS bid  Follow-up for Phone Call        done- pt aware. Army Fossa CMA  March 24, 2010 11:31 AM     Prescriptions: ADDERALL 20 MG  TABS (AMPHETAMINE-DEXTROAMPHETAMINE) bid  #60 x 0   Entered by:   Army Fossa CMA   Authorized by:   Loreen Freud DO   Signed by:   Army Fossa CMA on 03/24/2010   Method used:   Print then Give to Patient   RxID:   [id number]

## 2011-01-20 NOTE — Assessment & Plan Note (Signed)
Summary: Follow up on Adderall/drb   Vital Signs:  Patient profile:   35 year old female Weight:      170.38 pounds Pulse rate:   76 / minute Pulse rhythm:   regular BP sitting:   122 / 80  (left arm) Cuff size:   regular  Vitals Entered By: Army Fossa CMA (Apr 27, 2010 11:05 AM) CC: Pt here for follow up on adderall    History of Present Illness: Pt here for f/u add---Doing well with dose.  No complaints.    Current Medications (verified): 1)  Acyclovir 200 Mg Caps (Acyclovir) .... Take 2 Capsules By Mouth Each Day For Herpes Prevention 2)  Adderall 20 Mg  Tabs (Amphetamine-Dextroamphetamine) .... Bid 3)  Clenia Foaming Wash 10-5 %  Emul (Sulfacetamide Sodium-Sulfur) .... Use Three Times Daily. 4)  Selenium Sulfide 2.5 %  Lotn (Selenium Sulfide) .... Use As Directed 5)  Retin-A Micro 0.04 % Gel (Tretinoin Microsphere) .... Apply Qpm 6)  Ortho Tri-Cyclen (28) 0.035 Mg Tabs (Norgestimate-Ethinyl Estradiol) .... As Directed 7)  Clenia 10-5 % Crea (Sulfacetamide Sodium-Sulfur) .... Use Three Times Daily  Allergies: 1)  ! Codeine  Past History:  Past medical, surgical, family and social histories (including risk factors) reviewed for relevance to current acute and chronic problems.  Past Medical History: Reviewed history from 06/12/2008 and no changes required. G0 epidural hematoma post fall from balcony  Past Surgical History: Reviewed history from 09/12/2008 and no changes required. breast reduction ; TM repair post perforation lasik surgery  Family History: Reviewed history from 01/16/2008 and no changes required. CAD - no HTN - F. family DM - F family stroke - no colon ca - no breast ca - no  Social History: Reviewed history from 01/16/2008 and no changes required. Single Never Smoked Alcohol use-no advanced home care-- resp care  Review of Systems      See HPI  Physical Exam  General:  Well-developed,well-nourished,in no acute distress;  alert,appropriate and cooperative throughout examination Lungs:  Normal respiratory effort, chest expands symmetrically. Lungs are clear to auscultation, no crackles or wheezes. Heart:  normal rate and no murmur.   Psych:  Oriented X3, normally interactive, good eye contact, not anxious appearing, and not depressed appearing.     Impression & Recommendations:  Problem # 1:  ADD (ICD-314.00) refill adderall rto 6 months  Complete Medication List: 1)  Acyclovir 200 Mg Caps (Acyclovir) .... Take 2 capsules by mouth each day for herpes prevention 2)  Adderall 20 Mg Tabs (Amphetamine-dextroamphetamine) .... Bid 3)  Clenia Foaming Wash 10-5 % Emul (Sulfacetamide sodium-sulfur) .... Use three times daily. 4)  Selenium Sulfide 2.5 % Lotn (Selenium sulfide) .... Use as directed 5)  Retin-a Micro 0.04 % Gel (Tretinoin microsphere) .... Apply qpm 6)  Ortho Tri-cyclen (28) 0.035 Mg Tabs (Norgestimate-ethinyl estradiol) .... As directed 7)  Clenia 10-5 % Crea (Sulfacetamide sodium-sulfur) .... Use three times daily Prescriptions: ADDERALL 20 MG  TABS (AMPHETAMINE-DEXTROAMPHETAMINE) bid  #60 x 0   Entered and Authorized by:   Loreen Freud DO   Signed by:   Loreen Freud DO on 04/27/2010   Method used:   Print then Give to Patient   RxID:   [id number]

## 2011-01-20 NOTE — Progress Notes (Signed)
Summary: Adderall 11/2  Phone Note Refill Request Call back at Home Phone [phone] Message from:  Patient on October 20, 2010 11:00 AM  Refills Requested: Medication #1:  ADDERALL 20 MG  TABS bid Non-emergent, she has a few days left.  Initial call taken by: Lucious Groves CMA,  October 20, 2010 11:00 AM  Follow-up for Phone Call        Left message to call back due to Adderall not being available.      Almeta Monas CMA Duncan Dull)  October 20, 2010 2:45 PM  Left message to call back Almeta Monas CMA Duncan Dull)  October 21, 2010 9:43 AM   Additional Follow-up for Phone Call Additional follow up Details #1::        Pt called back advise pt of med not being available. Pt states that her pharmacy does have med will call them to confirm and if so we can print rx for her...........Marland KitchenFelecia Deloach CMA  October 21, 2010 11:31 AM     Additional Follow-up for Phone Call Additional follow up Details #2::    PT called back pharmacy doesnt have med in stock pt willing to try ritalin...............Marland KitchenFelecia Deloach CMA  October 21, 2010 3:25 PM  Dr.Lowne Please advise on the alternative for the Adderall 20mg .... Thank You    Additional Follow-up for Phone Call Additional follow up Details #3:: Details for Additional Follow-up Action Taken: Ritalin 10 mg two times a day #60  yrlowne 10/22/2010 845a  New/Updated Medications: RITALIN 10 MG TABS (METHYLPHENIDATE HCL) 1 by mouth two times a day Prescriptions: RITALIN 10 MG TABS (METHYLPHENIDATE HCL) 1 by mouth two times a day  #60 x 0   Entered by:   Almeta Monas CMA (AAMA)   Authorized by:   Loreen Freud DO   Signed by:   Almeta Monas CMA (AAMA) on 10/22/2010   Method used:   Print then Give to Patient   RxID:   [id number] ADDERALL 20 MG  TABS (AMPHETAMINE-DEXTROAMPHETAMINE) bid  #60 x 0   Entered by:   Lucious Groves CMA   Authorized by:   Loreen Freud DO   Signed by:   Lucious Groves CMA on 10/20/2010   Method used:   Print then  Give to Patient   RxID:   [id number]

## 2011-01-20 NOTE — Progress Notes (Signed)
Summary: refill  Phone Note Call from Patient Call back at Home Phone [phone]   Caller: Patient Summary of Call: Pt called back stating that she would like to have rx for adderall instead. Pt states that she has contacted pharmacy in [place] and they have med available there and all she need is her rx for med............Marland KitchenFelecia Deloach CMA  October 22, 2010 12:44 PM   Follow-up for Phone Call        Adderall Rx is already at check In, pt notified Rx ready for pickup.... Follow-up by: Almeta Monas CMA Duncan Dull),  October 22, 2010 1:36 PM     Appended Document: refill spk with Pharmacist who verified RX and stated they only had 49 pills available and pt agreed to taking them. Pt rcv'd 49 out of the 60 pills.

## 2011-01-20 NOTE — Assessment & Plan Note (Signed)
Summary: FREQUENT HEADACHES/KN   Vital Signs:  Patient profile:   35 year old female Height:      70.5 inches Weight:      171 pounds BMI:     24.28 Temp:     99.5 degrees F oral Pulse rate:   87 / minute BP sitting:   118 / 62  (left arm)  Vitals Entered By: Jeremy Johann CMA (June 30, 2010 3:04 PM) CC: frequent headache, discuss changing med Comments REVIEWED MED LIST, PATIENT AGREED DOSE AND INSTRUCTION CORRECT    History of Present Illness: Pt here c/o more frequent headaches over the last 3 years.   Pt is getting headaches 2 x a month and they last several hours if she doesn't take anything but she usually takes ibuprofen at the first sign and it usually takes care of it but not always.  She sometimes also wakes up with them at times.    Current Medications (verified): 1)  Acyclovir 200 Mg Caps (Acyclovir) .... Take 2 Capsules By Mouth Each Day For Herpes Prevention 2)  Adderall 20 Mg  Tabs (Amphetamine-Dextroamphetamine) .... Bid 3)  Clenia Foaming Wash 10-5 %  Emul (Sulfacetamide Sodium-Sulfur) .... Use Three Times Daily. 4)  Selenium Sulfide 2.5 %  Lotn (Selenium Sulfide) .... Use As Directed 5)  Retin-A Micro 0.04 % Gel (Tretinoin Microsphere) .... Apply Qpm 6)  Ortho Tri-Cyclen (28) 0.035 Mg Tabs (Norgestimate-Ethinyl Estradiol) .... As Directed 7)  Clenia 10-5 % Crea (Sulfacetamide Sodium-Sulfur) .... Use Three Times Daily  Allergies: 1)  ! Codeine  Past History:  Past medical, surgical, family and social histories (including risk factors) reviewed for relevance to current acute and chronic problems.  Past Medical History: Reviewed history from 06/12/2008 and no changes required. G0 epidural hematoma post fall from balcony  Past Surgical History: Reviewed history from 09/12/2008 and no changes required. breast reduction ; TM repair post perforation lasik surgery  Family History: Reviewed history from 01/16/2008 and no changes required. CAD - no HTN - F.  family DM - F family stroke - no colon ca - no breast ca - no  Social History: Reviewed history from 01/16/2008 and no changes required. Single Never Smoked Alcohol use-no advanced home care-- resp care  Review of Systems      See HPI  Physical Exam  General:  Well-developed,well-nourished,in no acute distress; alert,appropriate and cooperative throughout examination Eyes:  vision grossly intact, pupils equal, pupils round, pupils reactive to light, and no injection.   Ears:  External ear exam shows no significant lesions or deformities.  Otoscopic examination reveals clear canals, tympanic membranes are intact bilaterally without bulging, retraction, inflammation or discharge. Hearing is grossly normal bilaterally. Nose:  External nasal examination shows no deformity or inflammation. Nasal mucosa are pink and moist without lesions or exudates. Lungs:  Normal respiratory effort, chest expands symmetrically. Lungs are clear to auscultation, no crackles or wheezes. Msk:  No deformity or scoliosis noted of thoracic or lumbar spine.   Extremities:  No clubbing, cyanosis, edema, or deformity noted with normal full range of motion of all joints.   Neurologic:  No cranial nerve deficits noted. Station and gait are normal. Plantar reflexes are down-going bilaterally. DTRs are symmetrical throughout. Sensory, motor and coordinative functions appear intact. Psych:  Cognition and judgment appear intact. Alert and cooperative with normal attention span and concentration. No apparent delusions, illusions, hallucinations   Impression & Recommendations:  Problem # 1:  HEADACHE (ICD-784.0) Pt with hx head injury about  10 years ago  pt also grinds her teeth---get night guard Orders: Radiology Referral (Radiology)  Problem # 2:  ACNE VULGARIS (ICD-706.1) Assessment: Improved con't current bcp Her updated medication list for this problem includes:    Clenia Foaming Wash 10-5 % Emul  (Sulfacetamide sodium-sulfur) ..... Use three times daily.    Retin-a Micro 0.04 % Gel (Tretinoin microsphere) .Marland Kitchen... Apply qpm    Clenia 10-5 % Crea (Sulfacetamide sodium-sulfur) ..... Use three times daily  Discussed care of the skin and different treatment options.   Problem # 3:  RESTLESS LEGS SYNDROME (ICD-333.94)  calcium/mg vita D tonic water  Complete Medication List: 1)  Acyclovir 200 Mg Caps (Acyclovir) .... Take 2 capsules by mouth each day for herpes prevention 2)  Adderall 20 Mg Tabs (Amphetamine-dextroamphetamine) .... Bid 3)  Clenia Foaming Wash 10-5 % Emul (Sulfacetamide sodium-sulfur) .... Use three times daily. 4)  Selenium Sulfide 2.5 % Lotn (Selenium sulfide) .... Use as directed 5)  Retin-a Micro 0.04 % Gel (Tretinoin microsphere) .... Apply qpm 6)  Ortho Tri-cyclen (28) 0.035 Mg Tabs (Norgestimate-ethinyl estradiol) .... As directed 7)  Clenia 10-5 % Crea (Sulfacetamide sodium-sulfur) .... Use three times daily

## 2011-01-20 NOTE — Progress Notes (Signed)
Summary: adderall and selenium refill   Phone Note Refill Request Message from:  Patient on February 20, 2010 9:04 AM  Refills Requested: Medication #1:  ADDERALL 20 MG  TABS bid  Medication #2:  SELENIUM SULFIDE 2.5 %  LOTN use as directed Initial call taken by: Doristine Devoid,  February 20, 2010 9:06 AM  Follow-up for Phone Call        patient aware prescription ready to pick up and other prescription to be sent to [facility]Marland KitchenMarland KitchenDoristine Devoid  February 20, 2010 9:07 AM     Prescriptions: SELENIUM SULFIDE 2.5 %  LOTN (SELENIUM SULFIDE) use as directed  #4oz x 1   Entered by:   Doristine Devoid   Authorized by:   Marga Melnick MD   Signed by:   Doristine Devoid on 02/20/2010   Method used:   Electronically to        [facility]* (mail-order)             ,          Ph: [phone]       Fax: [phone]   RxID:   [id number] ADDERALL 20 MG  TABS (AMPHETAMINE-DEXTROAMPHETAMINE) bid  #60 x 0   Entered by:   Doristine Devoid   Authorized by:   Marga Melnick MD   Signed by:   Doristine Devoid on 02/20/2010   Method used:   Print then Give to Patient   RxID:   [id number]

## 2011-01-20 NOTE — Progress Notes (Signed)
Summary: Refill Request  Phone Note Refill Request Call back at Home Phone [phone] Message from:  Patient on July 13, 2010 11:17 AM  Refills Requested: Medication #1:  ADDERALL 20 MG  TABS bid   Dosage confirmed as above?Dosage Confirmed   Brand Name Necessary? No   Supply Requested: 1 month  Method Requested: Pick up at Office Next Appointment Scheduled: none Initial call taken by: Harold Barban,  July 13, 2010 11:17 AM Caller: Patient    Prescriptions: ADDERALL 20 MG  TABS (AMPHETAMINE-DEXTROAMPHETAMINE) bid  #60 x 0   Entered by:   Jeremy Johann CMA   Authorized by:   Loreen Freud DO   Signed by:   Jeremy Johann CMA on 07/13/2010   Method used:   Print then Give to Patient   RxID:   [id number]

## 2011-01-21 NOTE — Assessment & Plan Note (Signed)
Summary: sore throat/cbs   Vital Signs:  Patient profile:   35 year old female Weight:      182 pounds O2 Sat:      97 % on Room air Temp:     98.6 degrees F oral Pulse rate:   94 / minute Pulse rhythm:   regular BP sitting:   118 / 72  (left arm) Cuff size:   regular  Vitals Entered By: Army Fossa CMA (December 10, 2010 10:39 AM)  O2 Flow:  Room air CC: Pt here c/o sore throat started yesterday Comments [facility] [address]    History of Present Illness: developed a sore throat yesterday along with ear pressure and congestion bilaterally. She also had some head and facial congestion but less intense. Similar symptoms 10 days ago that self resolved. Several people in her office with strep  Current Medications (verified): 1)  Acyclovir 200 Mg Caps (Acyclovir) .... Take 2 Capsules By Mouth Each Day For Herpes Prevention 2)  Adderall 20 Mg  Tabs (Amphetamine-Dextroamphetamine) .... Bid 3)  Clenia Foaming Wash 10-5 %  Emul (Sulfacetamide Sodium-Sulfur) .... Use Three Times Daily. 4)  Ortho Tri-Cyclen (28) 0.035 Mg Tabs (Norgestimate-Ethinyl Estradiol) .... As Directed 5)  Clenia 10-5 % Crea (Sulfacetamide Sodium-Sulfur) .... Use Three Times Daily 6)  Ritalin 10 Mg Tabs (Methylphenidate Hcl) .Marland Kitchen.. 1 By Mouth Two Times A Day  Allergies (verified): 1)  ! Codeine  Past History:  Past Medical History: Reviewed history from 06/12/2008 and no changes required. G0 epidural hematoma post fall from balcony  Past Surgical History: Reviewed history from 09/30/2010 and no changes required. breast reduction ; TM repair post perforation lasik surgery Carpal tunnel release (12/2009) Right--sypher  Social History: Reviewed history from 01/16/2008 and no changes required. Single Never Smoked Alcohol use-no advanced home care-- resp care  Review of Systems General:  Denies fever. ENT:  some  PN drip. Resp:  Denies sputum productive; mild cough and chest congestion. GI:  Denies  vomiting; mild nausea.  Physical Exam  General:  alert and well-developed.   Head:  face symmetric, nontender to palpation Ears:  no redness or discharge Nose:  congestive Mouth:  tonsils not seen, no redness, no discharge, uvula midline Lungs:  normal respiratory effort, no intercostal retractions, no accessory muscle use, and normal breath sounds.     Impression & Recommendations:  Problem # 1:  URI (ICD-465.9)  see instructions   Orders: Rapid Strep (84696)  Complete Medication List: 1)  Acyclovir 200 Mg Caps (Acyclovir) .... Take 2 capsules by mouth each day for herpes prevention 2)  Adderall 20 Mg Tabs (Amphetamine-dextroamphetamine) .... Bid 3)  Clenia Foaming Wash 10-5 % Emul (Sulfacetamide sodium-sulfur) .... Use three times daily. 4)  Ortho Tri-cyclen (28) 0.035 Mg Tabs (Norgestimate-ethinyl estradiol) .... As directed 5)  Clenia 10-5 % Crea (Sulfacetamide sodium-sulfur) .... Use three times daily 6)  Ritalin 10 Mg Tabs (Methylphenidate hcl) .Marland Kitchen.. 1 by mouth two times a day 7)  Amoxicillin 500 Mg Tabs (Amoxicillin) .... 2 by mouth two times a day  Patient Instructions: 1)  rest, fluids, tylenol 2)  Sudafed behind the counter 30mg  4 times a day for congestion 3)  Robitussin DM twice a day for cough 4)  if no better in few days , start amoxicillin 5)  call if symptoms severe, high fever Prescriptions: AMOXICILLIN 500 MG TABS (AMOXICILLIN) 2 by mouth two times a day  #28 x 0   Entered and Authorized by:   Nolon Rod.  Paz MD   Signed by:   Nolon Rod. Paz MD on 12/10/2010   Method used:   Print then Give to Patient   RxID:   [id number]    Orders Added: 1)  Rapid Strep [87880] 2)  Est. Patient Level III [99213]    Laboratory Results    Other Tests  Rapid Strep: negative Comments: Army Fossa CMA  December 10, 2010 10:40 AM

## 2011-01-21 NOTE — Progress Notes (Signed)
Summary: Adderall RF  Phone Note Refill Request   Refills Requested: Medication #1:  ADDERALL 20 MG  TABS bid Per patient the pharmacy has the 10mg  Adderall and would rather take that because the Ritalin did not work c/b # [id number]...Marland Kitchen please advise   Method Requested: Pick up at Office Initial call taken by: Almeta Monas CMA Duncan Dull),  December 24, 2010 11:37 AM  Follow-up for Phone Call        adderall 10 mg 2 by mouth two times a day  Follow-up by: Loreen Freud DO,  December 24, 2010 1:01 PM  Additional Follow-up for Phone Call Additional follow up Details #1::        Vm left advising pt Rx ready for pick up.... Almeta Monas CMA (AAMA)  December 24, 2010 1:17 PM     New/Updated Medications: ADDERALL 10 MG TABS (AMPHETAMINE-DEXTROAMPHETAMINE) 2 by mouth two times a day Prescriptions: ADDERALL 10 MG TABS (AMPHETAMINE-DEXTROAMPHETAMINE) 2 by mouth two times a day  #120 x 0   Entered and Authorized by:   Loreen Freud DO   Signed by:   Loreen Freud DO on 12/24/2010   Method used:   Print then Give to Patient   RxID:   [id number]

## 2011-01-21 NOTE — Progress Notes (Signed)
Summary: refill  Phone Note Refill Request Call back at Home Phone [phone] Message from:  Patient  Refills Requested: Medication #1:  SOMA 250 MG TABS (CARISOPRODOL) 1 by mouth at bedtime as needed Pt states that she grinds her teeth real bad while sleeping. Pt notes that she is having a lot  of pain in jaw due to grinding. Pt would like to get refill of med. Pt uses [facility]. Pls advise................Marland KitchenFelecia Deloach CMA  December 17, 2010 11:42 AM     Follow-up for Phone Call        ok for #30, no refills Follow-up by: Neena Rhymes MD,  December 17, 2010 12:05 PM  Additional Follow-up for Phone Call Additional follow up Details #1::        Pt aware Rx sent to pharmacy.........Marland KitchenFelecia Deloach CMA  December 17, 2010 12:08 PM     New/Updated Medications: SOMA 250 MG TABS (CARISOPRODOL) Take 1 by mouth at bedtime as needed Prescriptions: SOMA 250 MG TABS (CARISOPRODOL) Take 1 by mouth at bedtime as needed  #30 x 0   Entered by:   Jeremy Johann CMA   Authorized by:   Neena Rhymes MD   Signed by:   Jeremy Johann CMA on 12/17/2010   Method used:   Faxed to ...       [facility] [address] (retail)       [phone]       [place], [place]  [address]       Ph: [phone]       Fax: [phone]   RxID:   [id number]

## 2011-01-21 NOTE — Letter (Signed)
Summary: Out of Work  [facility]  [address] [place], [place] [address]   Phone: [phone]  Fax: [phone]    December 10, 2010   Employee:  Zani Pestka    To Whom It May Concern:   For Medical reasons, please excuse the above named employee from work for the following dates:  Start:   December 10, 2010  End:   December 10, 2010   If you need additional information, please feel free to contact our office.         Sincerely,    Army Fossa CMA

## 2011-01-25 ENCOUNTER — Ambulatory Visit: Payer: Self-pay | Admitting: Family Medicine

## 2011-01-27 ENCOUNTER — Telehealth (INDEPENDENT_AMBULATORY_CARE_PROVIDER_SITE_OTHER): Payer: Self-pay | Admitting: *Deleted

## 2011-02-04 NOTE — Progress Notes (Signed)
Summary: refill  Phone Note Refill Request   Refills Requested: Medication #1:  ADDERALL 10 MG TABS 2 by mouth two times a day Pt aware Rx ready for [facility] tomorrow after 9am............Marland KitchenFelecia Deloach CMA  January 27, 2011 3:19 PM   Initial call taken by: Jeremy Johann CMA,  January 27, 2011 3:17 PM    Prescriptions: ADDERALL 10 MG TABS (AMPHETAMINE-DEXTROAMPHETAMINE) 2 by mouth two times a day  #120 x 0   Entered by:   Jeremy Johann CMA   Authorized by:   Loreen Freud DO   Signed by:   Jeremy Johann CMA on 01/27/2011   Method used:   Print then Give to Patient   RxID:   [id number]

## 2011-02-09 ENCOUNTER — Ambulatory Visit (INDEPENDENT_AMBULATORY_CARE_PROVIDER_SITE_OTHER): Payer: BC Managed Care – PPO

## 2011-02-09 ENCOUNTER — Encounter: Payer: Self-pay | Admitting: Family Medicine

## 2011-02-09 DIAGNOSIS — Z23 Encounter for immunization: Secondary | ICD-10-CM

## 2011-02-16 NOTE — Assessment & Plan Note (Signed)
Summary: tdap-booster with pertussis//fd  Nurse Visit   Allergies: 1)  ! Codeine  Immunizations Administered:  Tetanus Vaccine:    Vaccine Type: Tdap    Site: right deltoid    Mfr: GlaxoSmithKline    Dose: 0.5 ml    Route: IM    Given by: Jeremy Johann CMA    Exp. Date: 11/12/2012    Lot #: [id number]    VIS given: 11/06/08 version given February 09, 2011.  Orders Added: 1)  Tdap => 54yrs IM [90715] 2)  Admin 1st Vaccine [78295]

## 2011-02-24 ENCOUNTER — Telehealth: Payer: Self-pay | Admitting: Family Medicine

## 2011-03-02 ENCOUNTER — Ambulatory Visit (HOSPITAL_BASED_OUTPATIENT_CLINIC_OR_DEPARTMENT_OTHER)
Admission: RE | Admit: 2011-03-02 | Discharge: 2011-03-02 | Disposition: A | Discharge status: Home / Self Care | Payer: BC Managed Care – PPO | Source: Ambulatory Visit | Attending: Orthopedic Surgery | Admitting: Orthopedic Surgery

## 2011-03-02 DIAGNOSIS — G5600 Carpal tunnel syndrome, unspecified upper limb: Secondary | ICD-10-CM | POA: Insufficient documentation

## 2011-03-02 HISTORY — PX: CARPAL TUNNEL RELEASE: SHX101

## 2011-03-02 NOTE — Progress Notes (Signed)
Summary: refill  Phone Note Refill Request Call back at Home Phone [phone]   Refills Requested: Medication #1:  ADDERALL 10 MG TABS 2 by mouth two times a day Pt states that the pharmacy she uses does have 20 mg now and would like to change med back .Felecia Deloach CMA  February 24, 2011 2:13 PM    Follow-up for Phone Call        pt was taking Adderall 20mg   two times a day is it ok to switch back? Follow-up by: Almeta Monas CMA Duncan Dull),  February 24, 2011 3:20 PM  Additional Follow-up for Phone Call Additional follow up Details #1::        yes Additional Follow-up by: Loreen Freud DO,  February 24, 2011 3:29 PM    New/Updated Medications: ADDERALL 20 MG TABS (AMPHETAMINE-DEXTROAMPHETAMINE) 1 by mouth two times a day Prescriptions: ADDERALL 20 MG TABS (AMPHETAMINE-DEXTROAMPHETAMINE) 1 by mouth two times a day  #60 x 0   Entered by:   Almeta Monas CMA (AAMA)   Authorized by:   Loreen Freud DO   Signed by:   Almeta Monas CMA (AAMA) on 02/24/2011   Method used:   Print then Give to Patient   RxID:   [id number]

## 2011-03-03 LAB — POCT HEMOGLOBIN-HEMACUE: Hemoglobin: 14.1 g/dL (ref 12.0–15.0)

## 2011-03-04 NOTE — Op Note (Signed)
Joy [name]                ACCOUNT NO.:  [id number]  MEDICAL RECORD NO.:  [id number]          PATIENT TYPE:  LOCATION:                                 FACILITY:  PHYSICIAN:  Katy Fitch. Dailee Manalang, M.D.      DATE OF BIRTH:  DATE OF PROCEDURE:  03/02/2011 DATE OF DISCHARGE:                              OPERATIVE REPORT   PREOPERATIVE DIAGNOSIS:  Chronic entrapment neuropathy, left median nerve at carpal tunnel.  POSTOPERATIVE DIAGNOSIS:  Chronic entrapment neuropathy, left median nerve at carpal tunnel.  OPERATIONS:  Release of left transverse carpal ligament.  OPERATING SURGEON:  Katy Fitch. Stephen Baruch, MD  ASSISTANT:  Marveen Reeks Dasnoit, PA-C  ANESTHESIA:  General by LMA.  SUPERVISING ANESTHESIOLOGIST:  Kaylyn Layer. Michelle Piper, MD  INDICATIONS:  Joy [name] is a 35 year old woman who is employed by [employer] in [place].  She has had history of bilateral hand numbness dating back to 2009.  She had been evaluated at our office at that time with electrodiagnostic studies confirming bilateral carpal tunnel syndrome.  She is status post release of her right transverse carpal ligament in 2011 with a very satisfactory improvement in her sensibility.  She has had 2 steroid injections on the left without relief of her numbness.  Therefore, she presents for evaluation and management of carpal tunnel syndrome, anticipating release of her left transverse carpal ligament at this time.  Preoperatively, she was reminded of the potential risks and benefits of surgery.  She is noted to be allergic to LATEX and CODEINE.  Questions were invited and answered in detail.  PROCEDURE IN DETAIL:  Joy [name] was brought to room 6 of the [facility] and placed in supine position on the operating table. Following an anesthesia consult by Dr. Michelle Piper, general anesthesia by LMA technique was recommended and accepted.  Under Dr. Deirdre Priest direct supervision, general anesthesia was induced  by LMA technique followed by routine Betadine scrub of left upper extremity.  A pneumatic tourniquet was applied to her proximal brachium.  Following exsanguination of left arm with Esmarch bandage, arterial tourniquet was inflated to 220 mmHg. Procedure commenced with a routine surgical time-out.  A short incision was fashioned in the line of the ring finger palm.  Subcutaneous tissues were carefully divided in the palmar fascia.  This was split longitudinally to the common sensory branch of the median nerve.  These were followed back to the transverse carpal ligament that was subsequently separated from the median nerve proper with the aid of a Penfield 4 elevator.  Once the ligament was identified superficially and deep, ligaments released with scissors along its ulnar border extending into the distal forearm.  This widely opened the carpal canal.  The contents of carpal canal were inspected and found to be a remarkable for fibrotic tenosynovium.  Bleeding points were electrocauterized with bipolar current followed by repair of the skin with intradermal 3-0 Prolene suture.  A compressive dressing was applied with a volar plaster splint maintaining the wrist in 5 degrees of dorsiflexion.  For aftercare, Joy [name] will be provided a prescription for Dilaudid 2 mg one p.o.  q.4-6 h. p.r.n. pain, 20 tablets without refill.  We anticipate seeing her back for followup in our office in 1 week for suture removal and initiation of her postoperative rehabilitation program.     Katy Fitch. Joy [name], M.D.     RVS/MEDQ  D:  03/02/2011  T:  03/03/2011  Job:  [id number]  Electronically Signed by [name] M.D. on 03/04/2011 12:53:09 PM

## 2011-03-07 LAB — POCT HEMOGLOBIN-HEMACUE: Hemoglobin: 13.2 g/dL (ref 12.0–15.0)

## 2011-03-24 ENCOUNTER — Other Ambulatory Visit: Payer: Self-pay

## 2011-03-24 MED ORDER — AMPHETAMINE-DEXTROAMPHETAMINE 20 MG PO TABS: 20.0000 mg | ORAL_TABLET | Freq: Two times a day (BID) | ORAL | Status: DC

## 2011-03-24 NOTE — Telephone Encounter (Signed)
Pt aware Rx ready for pick up tomorrow    KP

## 2011-03-25 ENCOUNTER — Other Ambulatory Visit: Payer: Self-pay

## 2011-03-25 MED ORDER — AMPHETAMINE-DEXTROAMPHETAMINE 20 MG PO TABS: 20.0000 mg | ORAL_TABLET | Freq: Two times a day (BID) | ORAL | Status: DC

## 2011-04-22 ENCOUNTER — Other Ambulatory Visit: Payer: Self-pay | Admitting: *Deleted

## 2011-04-22 MED ORDER — AMPHETAMINE-DEXTROAMPHETAMINE 20 MG PO TABS: 20.0000 mg | ORAL_TABLET | Freq: Two times a day (BID) | ORAL | Status: DC

## 2011-04-22 NOTE — Telephone Encounter (Signed)
Pt aware Rx ready for pickup, 6 month f/u appt scheduled.

## 2011-04-26 ENCOUNTER — Encounter: Payer: Self-pay | Admitting: Family Medicine

## 2011-04-27 ENCOUNTER — Encounter: Payer: Self-pay | Admitting: Family Medicine

## 2011-04-27 ENCOUNTER — Ambulatory Visit (INDEPENDENT_AMBULATORY_CARE_PROVIDER_SITE_OTHER): Payer: BC Managed Care – PPO | Admitting: Family Medicine

## 2011-04-27 DIAGNOSIS — F988 Other specified behavioral and emotional disorders with onset usually occurring in childhood and adolescence: Secondary | ICD-10-CM

## 2011-04-27 DIAGNOSIS — R030 Elevated blood-pressure reading, without diagnosis of hypertension: Secondary | ICD-10-CM

## 2011-04-27 MED ORDER — AMPHETAMINE-DEXTROAMPHETAMINE 20 MG PO TABS: 20.0000 mg | ORAL_TABLET | Freq: Two times a day (BID) | ORAL | Status: DC

## 2011-04-27 NOTE — Assessment & Plan Note (Signed)
con't meds bp good today off bcp rto 3 months or sooner prn

## 2011-04-27 NOTE — Progress Notes (Signed)
  Subjective:    Patient ID: Joy [name], female    DOB: [birth year], 35 y.o.   MRN: [id number]  HPI  Pt here f/u adderall but is concerned because her bp ran high in gyn office.  Pt stopped bcp on her own because she knew it could elevate her bp.   She was not feeling well at the time.  No headaches, cp or sob.  Review of Systems As above    Objective:   Physical Exam  Constitutional: She is oriented to person, place, and time. She appears well-developed and well-nourished. No distress.  Neck: Normal range of motion. Neck supple.  Cardiovascular: Normal rate, regular rhythm and normal heart sounds.   Pulmonary/Chest: Effort normal and breath sounds normal.  Musculoskeletal: Normal range of motion.  Neurological: She is alert and oriented to person, place, and time.  Psychiatric: She has a normal mood and affect. Her behavior is normal. Judgment and thought content normal.          Assessment & Plan:

## 2011-04-27 NOTE — Patient Instructions (Signed)

## 2011-05-04 NOTE — Assessment & Plan Note (Signed)
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  These varicosities which are primarily in the lower thigh and calf area  cause aching, burning, and throbbing discomfort, which is increased by  being on her feet.  She works as a Horticulturist, commercial, and states that this is  affecting her ability to work because of the discomfort.  She has been  wearing long-leg graduated compression stockings since August of 2009,  which were recommended and prescribed by Dr. Laury Axon (20 mm - 30 mm long  leg) and has had no improvement in her symptoms.  She has also taken  ibuprofen, elevated the legs as much as possible with no success.  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   EXAMINATION:  Her right leg is unchanged with prominent varicosities  when she stands over the great saphenous system primarily below the  knee, extending down toward the ankle.  She has no hyperpigmentation,  ulceration, or distal edema at this time, but does have palpable distal  pulses.   I think she would benefit from laser ablation of her right greater  saphenous vein and multiple stab phlebectomies to improve or relieve her  symptoms, which are affecting her activities of daily living and ability  to work as a Horticulturist, commercial.  We will proceed with precertification for this  procedure.   [name], M.D.  Electronically Signed   JDL/MEDQ  D:  11/18/2008  T:  11/19/2008  Job:  [id number]

## 2011-05-04 NOTE — Procedures (Signed)
LOWER EXTREMITY VENOUS REFLUX EXAM   INDICATION:  Painful right lower extremity varicose veins.   EXAM:  Using color-flow imaging and pulse Doppler spectral analysis, the  right common femoral, superficial femoral, popliteal, posterior tibial,  greater and lesser saphenous veins are evaluated.  There is evidence  suggesting deep venous insufficiency in the right common femoral vein.   The right left saphenofemoral junction is not competent.  The right GSV  is not competent with the caliber as described below.   The right proximal short saphenous vein demonstrates competency.   GSV Diameter (used if found to be incompetent only)                                            Right    Left  Proximal Greater Saphenous Vein           1.0 cm   cm  Proximal-to-mid-thigh                     0.39 cm  cm  Mid thigh                                 0.37 cm  cm  Mid-distal thigh                          0.37 cm  cm  Distal thigh                              0.35 cm  cm  Knee                                      0.41 cm  cm   IMPRESSION:  1. 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  2. The right greater saphenous vein is not aneurysmal or tortuous.  3. The deep venous system is not competent at the right common femoral      vein level.  4. The right lesser saphenous vein is competent.   ___________________________________________  Quita Skye. Hart Rochester, M.D.   CH/MEDQ  D:  09/23/2008  T:  09/23/2008  Job:  [id number]

## 2011-05-04 NOTE — Assessment & Plan Note (Signed)
OFFICE VISIT   [name], Joy [name]  DOB:  [birth year]                                       01/13/2009  CHART#:[id number]   OFFICE VISIT:  The patient had stab phlebectomies of her right calf and  thigh 1 week ago for painful varicosities.  She did have reflux in her  great saphenous vein, but this vein was not very large and I was unable  to cannulate the vein, so no laser ablation performed.   On exam today she has no distal edema.  The stab phlebectomy wounds are  all healing nicely.  She has some moderate ecchymosis in the distal  thigh where the cannulation site was attempted.  She not having  significant pain.  Given her the okay to begin returning to her  exercises, advanced her and she will wear short-leg elastic compression  stockings.  She will return to see me 3 to 4 months for final check.   Joy [name], M.D.  Electronically Signed   JDL/MEDQ  D:  01/13/2009  T:  01/14/2009  Job:  [id number]

## 2011-05-04 NOTE — Assessment & Plan Note (Signed)
OFFICE VISIT   [name], Joy [name]  DOB:  09-28-1976                                       10/28/2008  CHART#:[id number]   I did not actually see her today, she is going to be returning at the  end of the month for evaluation, so she was a no-charge, she was not  seen.   [name], M.D.  Electronically Signed   JDL/MEDQ  D:  10/28/2008  T:  10/28/2008  Job:  [id number]

## 2011-05-04 NOTE — Consult Note (Signed)
VASCULAR SURGERY CONSULTATION   Joy [name], Joy [name]  DOB:  September 27, 1976                                       09/23/2008  CHART#:[id number]   This patient was referred for vascular surgery consultation by Dr. Laury Axon  for painful varicosities in the right lower extremity.  This healthy 35-  year-old female who works as a Geologist, engineering and also waits tables in  [employer] has been having increasing discomfort in her right leg from  painful varicosities over the last few years.  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  She saw Dr. Laury Axon in late August 2009 who  prescribed long-leg graduated compression stockings (20 mm - 30 mm) and  she has worn those for the past 6 weeks with no improvement in her  symptoms.  She does elevate her legs at night, but is unable to do this  during her work and dancing routines, and pain medication has not helped  her symptoms.  She has no history of deep venous thrombosis,  thrombophlebitis, bleeding, ulceration but does have increasing bulging  of the varicosities as the day progresses and some mild edema.   PAST MEDICAL HISTORY:  Negative for diabetes, hypertension, coronary  artery disease, hyperlipidemia, COPD or stroke.   PAST SURGICAL HISTORY:  1. Drainage of her epidural hematoma.  2. Breast reduction surgery.   FAMILY HISTORY:  Positive for varicose veins in her mother's side of the  family.  Positive for diabetes in a maternal aunt.  Negative for stroke  and coronary artery disease.   SOCIAL HISTORY:  She is single, works as a Passenger transport manager, and  also waits tables in [employer].  She does not use tobacco and has not  since 2004, and does not use alcohol.   REVIEW OF SYSTEMS:  Totally unremarkable with no significant  abnormalities in the review of systems.    MEDICATIONS:  Include Adderall and acyclovir.   ALLERGIES:  None known.   PHYSICAL EXAM:  Blood pressure is 132/86, heart rate 86, respirations  14.  General:  She is a healthy-appearing female in no apparent  distress, alert and oriented x3.  Neck:  Supple.  3+ carotid pulses  palpable.  No bruits are audible.  Neurologic:  Normal.  No palpable  adenopathy in the neck.  Chest:  Clear to auscultation.  Cardiovascular:  Regular rhythm.  No murmurs.  Abdomen:  Soft, nontender with no palpable  masses.  She has 3+ femoral, popliteal, and 2+ dorsalis pedis pulses  bilaterally.  Both feet are well perfused.  Right leg has bulging  varicosities over the greater saphenous system in the mid to distal  thigh and extending down into the medial calf in a typical location.  She has no hyperpigmentation or ulceration.  There is no evidence of  ischemia noted.   Venous duplex exam was performed in our office today and she has gross  reflux at the right saphenofemoral junction and in the right great  saphenous vein down to the knee level with a normal deep venous system.   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  We will treat her with a full 3 months of elastic compression stockings,  elevation, and analgesics, and have her return at that time to see if  she has had improvement in her symptoms.  If not, I think she would be a  good candidate for laser ablation of her right great saphenous vein with  multiple stab phlebectomies in the thigh and calf to relieve her  symptoms.   Joy [name], M.D.  Electronically Signed  JDL/MEDQ  D:  09/23/2008  T:  09/23/2008  Job:  [id number]   cc:   Lelon Perla, DO

## 2011-05-31 ENCOUNTER — Other Ambulatory Visit: Payer: Self-pay | Admitting: *Deleted

## 2011-05-31 DIAGNOSIS — F988 Other specified behavioral and emotional disorders with onset usually occurring in childhood and adolescence: Secondary | ICD-10-CM

## 2011-05-31 MED ORDER — AMPHETAMINE-DEXTROAMPHETAMINE 20 MG PO TABS: 20.0000 mg | ORAL_TABLET | Freq: Two times a day (BID) | ORAL | Status: DC

## 2011-05-31 NOTE — Telephone Encounter (Signed)
Pt aware Rx ready for pick up 

## 2011-07-07 ENCOUNTER — Other Ambulatory Visit: Payer: Self-pay | Admitting: *Deleted

## 2011-07-07 DIAGNOSIS — F988 Other specified behavioral and emotional disorders with onset usually occurring in childhood and adolescence: Secondary | ICD-10-CM

## 2011-07-07 NOTE — Telephone Encounter (Signed)
Please advise if it is ok to fill fr 90 days    KP

## 2011-07-08 MED ORDER — AMPHETAMINE-DEXTROAMPHETAMINE 20 MG PO TABS: 20.0000 mg | ORAL_TABLET | Freq: Two times a day (BID) | ORAL | Status: DC

## 2011-07-08 NOTE — Telephone Encounter (Signed)
Rx faxed.    KP 

## 2011-07-08 NOTE — Telephone Encounter (Signed)
Addended by: Candie Echevaria L on: 07/08/2011 12:49 PM   Modules accepted: Orders

## 2011-07-08 NOTE — Telephone Encounter (Signed)
Acyclovir Last filled 11-04-10 #180 2, last OV 04-27-11

## 2011-07-08 NOTE — Telephone Encounter (Signed)
Ok to fill for 90 days

## 2011-07-08 NOTE — Telephone Encounter (Signed)
Yes its ok

## 2011-07-28 ENCOUNTER — Ambulatory Visit: Payer: BC Managed Care – PPO | Admitting: Family Medicine

## 2011-08-02 MED ORDER — ACYCLOVIR 200 MG PO CAPS: ORAL_CAPSULE | ORAL | Status: DC

## 2011-08-02 NOTE — Telephone Encounter (Signed)
Addended by: Candie Echevaria L on: 08/02/2011 01:20 PM   Modules accepted: Orders

## 2011-08-02 NOTE — Telephone Encounter (Signed)
Rx faxed for acyclovir.

## 2011-08-09 ENCOUNTER — Ambulatory Visit (INDEPENDENT_AMBULATORY_CARE_PROVIDER_SITE_OTHER): Payer: BC Managed Care – PPO | Admitting: Family Medicine

## 2011-08-09 ENCOUNTER — Encounter: Payer: Self-pay | Admitting: Family Medicine

## 2011-08-09 VITALS — BP 116/82 | HR 69 | Temp 98.8°F | Wt 173.0 lb

## 2011-08-09 DIAGNOSIS — F988 Other specified behavioral and emotional disorders with onset usually occurring in childhood and adolescence: Secondary | ICD-10-CM

## 2011-08-09 DIAGNOSIS — Z7251 High risk heterosexual behavior: Secondary | ICD-10-CM

## 2011-08-09 DIAGNOSIS — F1210 Cannabis abuse, uncomplicated: Secondary | ICD-10-CM

## 2011-08-10 ENCOUNTER — Encounter: Payer: Self-pay | Admitting: Family Medicine

## 2011-08-10 DIAGNOSIS — F1210 Cannabis abuse, uncomplicated: Secondary | ICD-10-CM | POA: Insufficient documentation

## 2011-08-10 NOTE — Progress Notes (Signed)
  Subjective:    Patient ID: Joy [name], female    DOB: 1976-01-17, 35 y.o.   MRN: [id number]  HPI Pt here for add f/u.  Med working well.   Review of Systems No complaints    Objective:   Physical Exam  Constitutional: She is oriented to person, place, and time. She appears well-developed and well-nourished.  Cardiovascular: Normal rate, regular rhythm and normal heart sounds.   No murmur heard. Pulmonary/Chest: Effort normal and breath sounds normal. No respiratory distress. She has no wheezes. She has no rales.  Neurological: She is alert and oriented to person, place, and time.  Psychiatric: She has a normal mood and affect. Her behavior is normal.          Assessment & Plan:

## 2011-08-10 NOTE — Assessment & Plan Note (Signed)
con't meds rto 6 months 

## 2011-08-10 NOTE — Assessment & Plan Note (Signed)
Discussed risk with pt Recommended quitting

## 2011-09-22 ENCOUNTER — Encounter: Payer: Self-pay | Admitting: Vascular Surgery

## 2011-09-27 ENCOUNTER — Encounter: Payer: Self-pay | Admitting: Vascular Surgery

## 2011-09-28 ENCOUNTER — Ambulatory Visit (INDEPENDENT_AMBULATORY_CARE_PROVIDER_SITE_OTHER): Payer: BC Managed Care – PPO | Admitting: Vascular Surgery

## 2011-09-28 ENCOUNTER — Encounter: Payer: Self-pay | Admitting: Vascular Surgery

## 2011-09-28 ENCOUNTER — Other Ambulatory Visit (INDEPENDENT_AMBULATORY_CARE_PROVIDER_SITE_OTHER): Payer: BC Managed Care – PPO | Admitting: *Deleted

## 2011-09-28 VITALS — BP 112/70 | HR 73 | Resp 16 | Ht 70.0 in | Wt 174.0 lb

## 2011-09-28 DIAGNOSIS — I83893 Varicose veins of bilateral lower extremities with other complications: Secondary | ICD-10-CM

## 2011-09-28 NOTE — Progress Notes (Signed)
Subjective:     Patient ID: Joy [name], female   DOB: March 29, 1976, 35 y.o.   MRN: [id number]  HPI this healthy 35 year old female patient is to me having previously had multiple stab phlebectomy in January of 2010 for painful varicosities in the left leg. We attempted to treat the left great saphenous vein with laser ablation but were unable to cannulate the vein. She was asymptomatic following her stab phlebectomy procedure until a few months ago when she developed some pain in the right calf area. She has had no swelling, DVT, thrombophlebitis, stasis ulcers, or bleeding she does not wear elastic compression stockings the symptoms bother her the more she is up on her feet. She does not elevate her legs a regular basis nor take pain medicine.  Past Medical History  Diagnosis Date  . Epidural hematoma     post fall from balcony  . ADD (attention deficit disorder)   . Varicose veins     History  Substance Use Topics  . Smoking status: Former Smoker    Types: Cigarettes    Quit date: 12/20/2002  . Smokeless tobacco: Never Used  . Alcohol Use: No    Family History  Problem Relation Age of Onset  . Hypertension Father   . Diabetes Father   . Diabetes Maternal Aunt   . Other Other     varicose veins    Allergies  Allergen Reactions  . Codeine     REACTION: vomitting  . Latex Rash    Current outpatient prescriptions:acyclovir (ZOVIRAX) 200 MG capsule, Take 2 capsules by mouth each day for herpes prevention, Disp: 180 capsule, Rfl: 0;  amphetamine-dextroamphetamine (ADDERALL, 20MG ,) 20 MG tablet, Take 1 tablet (20 mg total) by mouth 2 (two) times daily., Disp: 180 tablet, Rfl: 0;  Biotin 1000 MCG tablet, Take 1,000 mcg by mouth daily.  , Disp: , Rfl:  calcium carbonate (OS-CAL - DOSED IN MG OF ELEMENTAL CALCIUM) 1250 MG tablet, Take 1 tablet by mouth daily.  , Disp: , Rfl: ;  glucosamine-chondroitin 500-400 MG tablet, Take 1 tablet by mouth 3 (three) times daily.  , Disp: , Rfl: ;   Multiple Vitamin (MULTIVITAMIN) tablet, Take 1 tablet by mouth daily.  , Disp: , Rfl: ;  Sulfacetamide Sodium-Sulfur (CLENIA FOAMING WASH) 10-5 % EMUL, Apply topically 3 (three) times daily. , Disp: , Rfl:   BP 112/70  Pulse 73  Resp 16  Ht 5\' 10"  (1.778 m)  Wt 174 lb (78.926 kg)  BMI 24.97 kg/m2  Body mass index is 24.97 kg/(m^2).         Review of Systems denies chest pain dyspnea on exertion PND orthopnea chronic cough and all symptoms. Complete negative review of systems    Objective:   Physical Exam blood pressure 112/70 heart rate 73 respirations 16 HEENT exam normal for age General she's a well-developed well-nourished female in no apparent distress alert and oriented x3 Chest no rhonchi or wheezing Cardiovascular regular rhythm no murmurs Abdomen soft nontender with no masses Her extremity exam reveals 3+ femoral popliteal and dorsalis pedis pulses palpable bilaterally excellent she has one prominent vein in the right leg beginning at the great saphenous vein just below the knee extending medially to the pretibial area. There is no hyperpigmentation or ulceration distally. No bulging varicosities are noted. There is no distal edema.   Today I ordered a venous duplex exam of the right leg. Reviewed and interpreted this. She has some reflux at the right saphenofemoral junction and  in the distal thigh at the knee level. The vein is small (great saphenous vein) there is no DVT.     Assessment:    recurrent prominent vein right calf appear off of the great saphenous system    Plan:     We'll treat this with sclerotherapy if she desires. No indication for laser ablation.

## 2011-09-30 ENCOUNTER — Ambulatory Visit: Payer: BC Managed Care – PPO | Admitting: Family Medicine

## 2011-10-01 ENCOUNTER — Ambulatory Visit (INDEPENDENT_AMBULATORY_CARE_PROVIDER_SITE_OTHER): Payer: BC Managed Care – PPO | Admitting: Family Medicine

## 2011-10-01 ENCOUNTER — Encounter: Payer: Self-pay | Admitting: Family Medicine

## 2011-10-01 ENCOUNTER — Other Ambulatory Visit: Payer: Self-pay | Admitting: Family Medicine

## 2011-10-01 DIAGNOSIS — L02519 Cutaneous abscess of unspecified hand: Secondary | ICD-10-CM

## 2011-10-01 MED ORDER — CEPHALEXIN 500 MG PO CAPS: 500.0000 mg | ORAL_CAPSULE | Freq: Four times a day (QID) | ORAL | Status: DC

## 2011-10-01 NOTE — Progress Notes (Signed)
  Subjective:    Patient ID: Joy [name], female    DOB: 20-Dec-1976, 35 y.o.   MRN: [id number]  HPI Pt here c/o bump on middle finger R hand.  It had gotten larger and drained pus yesterday.  Better today but tender.   No known bug bite etc.     Review of Systems    as directed Objective:   Physical Exam  Constitutional: She is oriented to person, place, and time. She appears well-developed and well-nourished.  Neurological: She is alert and oriented to person, place, and time.  Skin:       R mid finger--errythematous and swollen , tender to touch  Psychiatric: She has a normal mood and affect. Her behavior is normal.          Assessment & Plan:  Cellulitis finger---keflex qid for 10 days                        Culture done

## 2011-10-01 NOTE — Patient Instructions (Signed)
Cellulitis Cellulitis is an infection of the skin and the tissue beneath it. The area is typically red and tender. It is caused by germs (bacteria) (usually staph or strep) that enter the body through cuts or sores. Cellulitis most commonly occurs in the arms or lower legs.  HOME CARE INSTRUCTIONS  If you are given a prescription for medications which kill germs (antibiotics), take as directed until finished.   If the infection is on the arm or leg, keep the limb elevated as able.   Use a warm cloth several times per day to relieve pain and encourage healing.   See your caregiver for recheck of the infected site in a few days or sooner if problems arise.   Only take over-the-counter or prescription medicines for pain, discomfort, or fever as directed by your caregiver.  SEEK MEDICAL CARE IF:  An oral temperature above 100.4 develops, not controlled by medication.   The area of redness (inflammation) is spreading, there are red streaks coming from the infected site, or if a part of the infection begins to turn dark in color.   The joint or bone underneath the infected skin becomes painful after the skin has healed.   The infection returns in the same or another area after it seems to have gone away.   A boil or bump swells up. This may be an abscess.   New, unexplained problems such as pain or fever develop.  SEEK IMMEDIATE MEDICAL CARE IF:  You or your child feels drowsy or lethargic.   There is vomiting, diarrhea, or lasting discomfort or feeling ill (malaise) with muscle aches and pains.  MAKE SURE YOU:   Understand these instructions.   Will watch your condition.   Will get help right away if you are not doing well or get worse.  Document Released: 09/15/2005 Document Re-Released: 10/03/2009 [facility] Patient Information 2011 [place], [place].

## 2011-10-04 ENCOUNTER — Other Ambulatory Visit: Payer: Self-pay | Admitting: *Deleted

## 2011-10-04 MED ORDER — ACYCLOVIR 200 MG PO CAPS: ORAL_CAPSULE | ORAL | Status: DC

## 2011-10-04 NOTE — Telephone Encounter (Signed)
LAST OV 10-01-11, LAST FILLED 08-02-11 #180

## 2011-10-04 NOTE — Telephone Encounter (Signed)
Ok to refill x 1  

## 2011-10-04 NOTE — Telephone Encounter (Signed)
Rx sent to pharmacy   

## 2011-10-05 ENCOUNTER — Encounter: Payer: Self-pay | Admitting: Family Medicine

## 2011-10-05 ENCOUNTER — Ambulatory Visit (INDEPENDENT_AMBULATORY_CARE_PROVIDER_SITE_OTHER): Payer: BC Managed Care – PPO | Admitting: Family Medicine

## 2011-10-05 VITALS — BP 126/82 | HR 85 | Temp 98.2°F | Ht 72.0 in | Wt 178.4 lb

## 2011-10-05 DIAGNOSIS — Z22322 Carrier or suspected carrier of Methicillin resistant Staphylococcus aureus: Secondary | ICD-10-CM

## 2011-10-05 DIAGNOSIS — A4902 Methicillin resistant Staphylococcus aureus infection, unspecified site: Secondary | ICD-10-CM

## 2011-10-05 DIAGNOSIS — F988 Other specified behavioral and emotional disorders with onset usually occurring in childhood and adolescence: Secondary | ICD-10-CM

## 2011-10-05 LAB — WOUND CULTURE
Gram Stain: NONE SEEN
Gram Stain: NONE SEEN

## 2011-10-05 MED ORDER — SULFAMETHOXAZOLE-TRIMETHOPRIM 800-160 MG PO TABS: 1.0000 | ORAL_TABLET | Freq: Two times a day (BID) | ORAL | Status: DC

## 2011-10-05 MED ORDER — AMPHETAMINE-DEXTROAMPHETAMINE 20 MG PO TABS: 20.0000 mg | ORAL_TABLET | Freq: Two times a day (BID) | ORAL | Status: DC

## 2011-10-05 NOTE — Progress Notes (Signed)
  Subjective:    Patient ID: Joy [name], female    DOB: Nov 19, 1976, 35 y.o.   MRN: [id number]  HPI Pt here for f/u bp.  Her machine is measuring bp higher than ours.   We also received the culture from her wound on her finger and it is + MRSA.       Review of Systems As above    Objective:   Physical Exam  Constitutional: She is oriented to person, place, and time. She appears well-developed and well-nourished.  Cardiovascular: Normal rate and regular rhythm.   No murmur heard. Pulmonary/Chest: Effort normal and breath sounds normal. No respiratory distress. She has no wheezes. She has no rales.  Neurological: She is alert and oriented to person, place, and time.  Skin:       R mid finger---  Middle joint--+ swelling and errythema---better than last visit  Psychiatric: She has a normal mood and affect. Her behavior is normal. Judgment and thought content normal.          Assessment & Plan:  1  Elevated --bp---- resolved 2. MRSA--middle finger---change abx to bactrim  3. ADD---refill meds

## 2011-10-05 NOTE — Patient Instructions (Signed)
MRSA Overview MRSA stands for methicillin-resistant staphylococcus aureus. It is a type of bacteria that is resistant to some common antibiotics and can cause infections in the skin and many other places in the body. Staphylococcus aureus, often called "staph," are bacteria that normally live on the skin or in the nose. Staph on the surface of the skin do not cause problems. However, if the staph enter the body through a cut or a wound or another break in the skin, an infection can happen.   Up until recently, problems with the MRSA type of staph have mainly been in healthcare settings. There are now more and more problems with MRSA infections in the community. Infections with MRSA are serious health problems that can cause death.  Most MRSA infections are acquired in one of two ways:  Healthcare-Associated MRSA (HA-MRSA):   Can be acquired by people in any healthcare setting. MRSA can be a big problem for the elderly, people with weakened immune systems, dialysis patients (people undergoing blood cleansing treatment because of kidney failure), and those who have had surgery.   Community-Associated MRSA (CA-MRSA):   Community spread of MRSA is becoming more common. It is known to spread among small children, in crowded settings or in situations where there is close skin-to-skin contact. MRSA can be spread through shared items, such as children's toys, razors, towels or sports equipment.  CAUSES All staph, including MRSA, are normally harmless unless they enter the body through a scratch, cut or another wound such as with surgery. All staph, including MRSA, can be spread from person to person by touching contaminated objects as well as through direct contact. SPECIAL GROUPS MRSA can present problems for special groups of people. Some of these groups include:  Breastfeeding women.   The most common problem is CA-MRSA infection of the breast (mastitis). There is evidence that MRSA can be passed to  an infant from infected breast milk. Your caregiver may recommend that you stop breastfeeding until the mastitis is under control.   If you are breastfeeding and have a MRSA infection in a place other than the breast, you may usually continue breastfeeding while under treatment. If taking antibiotics, ask your caregiver if it is safe to continue breastfeeding while taking your prescribed medicines.   Neonates (babies from birth to one month old) and infants (babies from one month to one year old). There is evidence that MRSA can be passed to an infant at birth if the mother has a CA-MRSA on the skin, in or around the birth canal, or an infection in the uterus, cervix, or vagina. CA-MRSA infection can look the same as a normal newborn or infant rash or several other skin infections. This can make it hard to diagnose MRSA.   Immune compromised individuals. If you have an immune system problem, you may have a higher chance of developing an MRSA infection.   Persons after any type of surgery. Staph in general, including MRSA, are the most common cause of infections that occur at the site of recent surgery.   Persons on long-term steroid medications. These kinds of medications can lower your resistance to infection. This can increase the chances of getting MRSA.  DIAGNOSIS  Diagnosis of MRSA is done by cultures of fluid samples that may come from:   Swabs taken from cuts or wounds in infected areas.   Nasal swabs.   Saliva or deep cough specimens from the lungs (sputum).   Urine.   Blood.   Many people   are "colonized" with MRSA but have no signs of infection. This means that people carry the MRSA germ on their skin or in their nose and do not have and may never develop MRSA infection.  TREATMENT Treatment varies and is based on how serious, how deep or how extensive the infection process is. For example:  Some skin infections, such as a small boil or abscess, may be treated by draining pus  from the site of the infection.   Deeper or more widespread soft tissue infections are usually treated with surgery to drain pus and antibiotics given by vein or by mouth. This may be recommended even if you are pregnant.   Serious infections may require a hospital stay.   If antibiotics are given, they may be needed for several weeks.  PREVENTION Because many people are colonized with staph, including MRSA, preventing spread of the bacteria from person to person is most important. The best way to prevent the spread of bacteria and other germs is through proper hand washing or by using alcohol-based hand disinfectants. The following are other ways to help prevent MRSA infection within the hospital and community settings.   Healthcare Settings:   In health care settings, strict hand washing or hand disinfection procedures should be followed before and after touching every patient.   Patients infected with MRSA are placed in isolation to prevent the spread of the bacteria.   Health care workers will routinely wear disposable gowns and gloves when touching or caring for patients infected with MRSA.   Hospital surfaces are disinfected frequently.   Community Settings:   Wash your hands frequently with soap and water for at least 15 seconds. Otherwise, use alcohol based hand disinfectants when soap and water is not available.   Do not share personal items. For example, avoid sharing razors, towels, clothing and athletic equipment.   Keep wounds covered. Pus from infected sores may contain MRSA and other bacteria. Keep cuts and abrasions clean and covered with sterile, dry bandages until they are healed.   If you have a wound that appears infected, ask your doctor if a culture for MRSA and other bacteria should be done.   Take all antibiotic medication as prescribed by your caregiver. Even if you feel better, do not stop taking it.   If you are breastfeeding, talk to your caregiver about  MRSA. You may be asked to temporarily stop breastfeeding.  HOME CARE INSTRUCTIONS  Take all medications as prescribed. Do not skip medications or stop them early just because you seem to be doing better.   For those with MRSA infections, avoid close contact with those around you as much as possible. Do not use towels, razors, toothbrushes, or bedding etc. that will be used by others.   To fight the infection, follow your caregiver's instructions for wound care.  SEEK IMMEDIATE MEDICAL CARE IF:  The infection appears to be getting worse, such as:   Increased warmth, redness or tenderness around the wound site.   A red line develops and extends from the infection site.   The area around the infection begins to turn dark in color.   Wound drainage is tan, yellow or green in appearance.   Foul smell.   You develop nausea and vomiting or cannot keep medicine down.   You or your child has an unexplained oral temperature above 102.0 F (38.9 C). You or your child develop sweating, chills or body aches and pains.   Your baby is older than   3 months with a rectal temperature of 102 F (38.9 C) or higher.   Your baby is 3 months old or younger with a rectal temperature of 100.4 F (38 C) or higher.   You have difficulty breathing.  MAKE SURE YOU:   Understand these instructions.   Will watch your condition.   Will get help right away if you are not doing well or get worse.  Document Released: 12/06/2005 Document Re-Released: 03/02/2010 [employer] Patient Information 2011 [employer]. 

## 2011-10-06 NOTE — Procedures (Unsigned)
LOWER EXTREMITY VENOUS REFLUX EXAM  INDICATION:  Varicose veins.  EXAM:  Using color-flow imaging and pulse Doppler spectral analysis, the right common femoral, superficial femoral, popliteal, posterior tibial, greater and lesser saphenous veins are evaluated.  There is evidence of deep venous insufficiency >500 milliseconds noted focally in the right common femoral vein.  The right saphenofemoral junction demonstrates reflux of >567milliseconds. The right nontortuous GSV demonstrates reflux of >545milliseconds.  The right proximal small saphenous vein demonstrates competency.  GSV Diameter (used if found to be incompetent only)                                           Right    Left Proximal Greater Saphenous Vein           0.42 cm  cm Proximal-to-mid-thigh                     cm       cm Mid thigh                                 0.44 cm  cm Mid-distal thigh                          cm       cm Distal thigh                              0.42 cm  cm Knee                                      0.35 cm  cm  IMPRESSION:  Right greater saphenous and common femoral vein reflux is noted, as described above.  ___________________________________________ Quita Skye. Hart Rochester, M.D.  CH/MEDQ  D:  09/29/2011  T:  09/29/2011  Job:  [id number]

## 2011-10-07 ENCOUNTER — Encounter: Payer: Self-pay | Admitting: *Deleted

## 2011-10-07 ENCOUNTER — Telehealth: Payer: Self-pay | Admitting: *Deleted

## 2011-10-07 NOTE — Telephone Encounter (Signed)
Per Dr Laury Axon ok to provide note. Pt aware and note printed and placed up front for pick up.

## 2011-10-07 NOTE — Telephone Encounter (Signed)
I thought we gave her a note when she was here.

## 2011-10-07 NOTE — Telephone Encounter (Signed)
Pt left VM that Dr Laury Axon took her out of work for 48 due to her recent Dx of MRSA. Pt indicated that her job is now require a note to allow her to return to work. Pt note that she was advise that she was able to return as long as she kept area covered. .Please advise

## 2011-10-14 ENCOUNTER — Telehealth: Payer: Self-pay | Admitting: Family Medicine

## 2011-10-14 DIAGNOSIS — Z22322 Carrier or suspected carrier of Methicillin resistant Staphylococcus aureus: Secondary | ICD-10-CM

## 2011-10-14 MED ORDER — SULFAMETHOXAZOLE-TRIMETHOPRIM 800-160 MG PO TABS: 1.0000 | ORAL_TABLET | Freq: Two times a day (BID) | ORAL | Status: AC

## 2011-10-14 NOTE — Telephone Encounter (Signed)
Refill bactrim for 7 more days

## 2011-10-14 NOTE — Telephone Encounter (Signed)
Patient made aware Rx sent to the pharmacy     KP

## 2011-10-14 NOTE — Telephone Encounter (Signed)
Discussed with patient and she stated that her finger is better but the infection is not completely gone she is on her last dose of medication and she wanted to know what you recommend? Please advise   Joy [name]

## 2011-12-10 ENCOUNTER — Other Ambulatory Visit: Payer: Self-pay | Admitting: *Deleted

## 2011-12-10 DIAGNOSIS — F988 Other specified behavioral and emotional disorders with onset usually occurring in childhood and adolescence: Secondary | ICD-10-CM

## 2011-12-10 NOTE — Telephone Encounter (Signed)
Last OV 10-16 12 last refill 10-05-11 #180 no refills

## 2011-12-10 NOTE — Telephone Encounter (Signed)
Pt is 1 month early for refill.  None at this time.

## 2011-12-13 MED ORDER — AMPHETAMINE-DEXTROAMPHETAMINE 20 MG PO TABS: 20.0000 mg | ORAL_TABLET | Freq: Two times a day (BID) | ORAL | Status: DC

## 2011-12-13 NOTE — Telephone Encounter (Signed)
Last filled 10/05/11 and taking 20 mg twice a day. She stated she is over due. She stated she only got 60 tablets from  [facility] on [address]. Please advise     KP     Discussed with Kha at the pharmacy and he confirmed that only 60 was giving. Please advise     Kp

## 2011-12-13 NOTE — Telephone Encounter (Signed)
Patient aware Rx ready for pick up.      KP 

## 2011-12-13 NOTE — Telephone Encounter (Signed)
Ok for #60, no refills 

## 2011-12-29 ENCOUNTER — Ambulatory Visit: Payer: BC Managed Care – PPO | Admitting: *Deleted

## 2012-01-05 ENCOUNTER — Other Ambulatory Visit: Payer: Self-pay | Admitting: *Deleted

## 2012-01-06 MED ORDER — NORGESTIM-ETH ESTRAD TRIPHASIC 0.18/0.215/0.25 MG-35 MCG PO TABS: 1.0000 | ORAL_TABLET | Freq: Every day | ORAL | Status: DC

## 2012-01-06 NOTE — Telephone Encounter (Signed)
Discussed with patient and she stated she hs not missed any pills, and she is not having any issues. Needs refill on OCP. Pap is done with GYN and had been done 02/18/11.

## 2012-01-26 ENCOUNTER — Telehealth: Payer: Self-pay | Admitting: *Deleted

## 2012-01-26 MED ORDER — ACYCLOVIR 200 MG PO CAPS: ORAL_CAPSULE | ORAL | Status: DC

## 2012-01-26 NOTE — Telephone Encounter (Signed)
Rx sent 

## 2012-01-26 NOTE — Telephone Encounter (Signed)
Office Message from Date: 01/26/2012 12:00:00 AM Time of Call: [phone] Faxed To: [facility] (Daytime Triage) Caller: Elyzabeth Fax Number: [phone] Facility: N/A Patient: Joy [name], Joy [name] DOB: February 19, 1976 Phone: [phone] Provider: Lelon Perla Message: Patient calling refill acyclovir Pharmacy is [facility] [id number] Regarding Appointment: Appt Date: Appt Time: Unknown Provider: Reason: Details: Outcome:

## 2012-02-08 ENCOUNTER — Other Ambulatory Visit: Payer: Self-pay

## 2012-02-08 DIAGNOSIS — F988 Other specified behavioral and emotional disorders with onset usually occurring in childhood and adolescence: Secondary | ICD-10-CM

## 2012-02-08 MED ORDER — AMPHETAMINE-DEXTROAMPHETAMINE 20 MG PO TABS: 20.0000 mg | ORAL_TABLET | Freq: Two times a day (BID) | ORAL | Status: DC

## 2012-02-08 NOTE — Telephone Encounter (Signed)
Pt aware Rx ready for pick up.    KP 

## 2012-02-14 ENCOUNTER — Telehealth: Payer: Self-pay

## 2012-02-14 DIAGNOSIS — F988 Other specified behavioral and emotional disorders with onset usually occurring in childhood and adolescence: Secondary | ICD-10-CM

## 2012-02-14 NOTE — Telephone Encounter (Signed)
Msg from patient stating she misplaced her Adderall and would like to get another Rx. She stated she did not have a car and was getting rides from people and thinks she left it in someone car.  Please advise     KP

## 2012-02-14 NOTE — Telephone Encounter (Signed)
Pharmacy will not fill  rx if already filled--- until do for refill.

## 2012-02-15 MED ORDER — AMPHETAMINE-DEXTROAMPHETAMINE 20 MG PO TABS: 20.0000 mg | ORAL_TABLET | Freq: Two times a day (BID) | ORAL | Status: DC

## 2012-02-15 NOTE — Telephone Encounter (Signed)
Ok to refill med

## 2012-02-15 NOTE — Telephone Encounter (Signed)
Patient has not filled the Rx she lost the hard copy and stated she want to make you aware she is not doing anything illegal and she honestly lost the Rx. Please advise   KP

## 2012-02-15 NOTE — Telephone Encounter (Signed)
Rx printed.    KP 

## 2012-02-24 ENCOUNTER — Other Ambulatory Visit: Payer: Self-pay | Admitting: Obstetrics and Gynecology

## 2012-03-20 HISTORY — PX: VULVA SURGERY: SHX837

## 2012-04-03 ENCOUNTER — Other Ambulatory Visit: Payer: Self-pay | Admitting: Family Medicine

## 2012-04-03 NOTE — Telephone Encounter (Signed)
Refill Triphasic 0.18/0.215/0.25 MG-35  Called into [facility] on [address] ph# [phone]  Can call patient at [phone]

## 2012-04-05 MED ORDER — NORGESTIM-ETH ESTRAD TRIPHASIC 0.18/0.215/0.25 MG-35 MCG PO TABS: 1.0000 | ORAL_TABLET | Freq: Every day | ORAL | Status: DC

## 2012-04-05 NOTE — Telephone Encounter (Signed)
Rx sent 

## 2012-04-07 ENCOUNTER — Ambulatory Visit: Admit: 2012-04-07 | Payer: Self-pay | Admitting: Obstetrics and Gynecology

## 2012-04-07 SURGERY — LABIAPLASTY, VULVA
Anesthesia: Choice | Laterality: Bilateral

## 2012-04-26 ENCOUNTER — Telehealth: Payer: Self-pay | Admitting: *Deleted

## 2012-04-26 DIAGNOSIS — F988 Other specified behavioral and emotional disorders with onset usually occurring in childhood and adolescence: Secondary | ICD-10-CM

## 2012-04-26 MED ORDER — AMPHETAMINE-DEXTROAMPHETAMINE 20 MG PO TABS: 20.0000 mg | ORAL_TABLET | Freq: Two times a day (BID) | ORAL | Status: DC

## 2012-04-26 MED ORDER — NORGESTIM-ETH ESTRAD TRIPHASIC 0.18/0.215/0.25 MG-35 MCG PO TABS: 1.0000 | ORAL_TABLET | Freq: Every day | ORAL | Status: DC

## 2012-04-26 NOTE — Telephone Encounter (Signed)
Patient would like to speak w/Dr. Ernst Spell nurse concerning some new information regarding medication refills and also requesting refill on Adderall w/call back when ready/SLS

## 2012-04-26 NOTE — Telephone Encounter (Signed)
Patient aware Rx for birth control sent to [facility] and Adderall ready for pick up.     KP

## 2012-07-11 ENCOUNTER — Other Ambulatory Visit: Payer: Self-pay | Admitting: Family Medicine

## 2012-07-11 NOTE — Telephone Encounter (Signed)
Which pharmacy?

## 2012-07-11 NOTE — Telephone Encounter (Signed)
Patient called stated she will be moving to [place] next week and her medications are out of refills, she would like to get the following sent to a NEW pharmacy [facility] [address] before she leaves  1-Acyclovir (Cap) ZOVIRAX 200 MG Take 2 capsules by mouth each day for herpes prevention Last fill 2.6.13 Wants a 90-day supply   2-Norgestimate-Ethinyl Estradiol Triphasic 0.18/0.215/0.25 MG-35 MCG Take 1 tablet by mouth daily Last fill 5.8.13 Wants 30-day supply   Patient cb# [phone]

## 2012-07-12 NOTE — Telephone Encounter (Signed)
[facility] on [address]

## 2012-07-14 MED ORDER — ACYCLOVIR 200 MG PO CAPS: ORAL_CAPSULE | ORAL | Status: DC

## 2012-07-14 MED ORDER — NORGESTIM-ETH ESTRAD TRIPHASIC 0.18/0.215/0.25 MG-35 MCG PO TABS: 1.0000 | ORAL_TABLET | Freq: Every day | ORAL | Status: DC

## 2012-07-14 NOTE — Telephone Encounter (Signed)
Pt called again regarding her refills. She wants to them called into [facility] (not [facility]) on [address].

## 2012-10-05 ENCOUNTER — Other Ambulatory Visit: Payer: Self-pay | Admitting: Family Medicine

## 2012-10-05 DIAGNOSIS — F988 Other specified behavioral and emotional disorders with onset usually occurring in childhood and adolescence: Secondary | ICD-10-CM

## 2012-10-05 MED ORDER — AMPHETAMINE-DEXTROAMPHETAMINE 20 MG PO TABS: 20.0000 mg | ORAL_TABLET | Freq: Two times a day (BID) | ORAL | Status: DC

## 2012-10-05 NOTE — Telephone Encounter (Signed)
Discussed with patient and she voiced understanding and if she can not find a provider in her town she will call for a follow up for medication management.      KP

## 2012-10-05 NOTE — Telephone Encounter (Signed)
Pt called and has moved but has not been able to find new doctor yet. She is asking for a refill on her adderall. Pls call pt back

## 2012-10-05 NOTE — Telephone Encounter (Signed)
Ok for 1 month- no additional refills from this office

## 2012-10-05 NOTE — Telephone Encounter (Signed)
Patient has not been seen in 1 year. Please advise    KP 

## 2012-12-29 ENCOUNTER — Encounter: Payer: Self-pay | Admitting: *Deleted

## 2012-12-29 ENCOUNTER — Ambulatory Visit (INDEPENDENT_AMBULATORY_CARE_PROVIDER_SITE_OTHER): Payer: Self-pay | Admitting: Family Medicine

## 2012-12-29 ENCOUNTER — Encounter: Payer: Self-pay | Admitting: Family Medicine

## 2012-12-29 VITALS — BP 122/80 | HR 71 | Temp 98.6°F | Wt 195.4 lb

## 2012-12-29 DIAGNOSIS — B009 Herpesviral infection, unspecified: Secondary | ICD-10-CM

## 2012-12-29 DIAGNOSIS — F988 Other specified behavioral and emotional disorders with onset usually occurring in childhood and adolescence: Secondary | ICD-10-CM

## 2012-12-29 MED ORDER — AMPHETAMINE-DEXTROAMPHETAMINE 20 MG PO TABS: 20.0000 mg | ORAL_TABLET | Freq: Two times a day (BID) | ORAL | Status: DC

## 2012-12-29 MED ORDER — ACYCLOVIR 200 MG PO CAPS: ORAL_CAPSULE | ORAL | Status: AC

## 2012-12-29 NOTE — Patient Instructions (Addendum)
Amphetamine; Dextroamphetamine tablets What is this medicine? AMPHETAMINE; DEXTROAMPHETAMINE(am FET a meen; dex troe am FET a meen) is used to treat attention-deficit hyperactivity disorder (ADHD). It may also be used for narcolepsy. Federal law prohibits giving this medicine to any person other than the person for whom it was prescribed. Do not share this medicine with anyone else. This medicine may be used for other purposes; ask your health care provider or pharmacist if you have questions. What should I tell my health care provider before I take this medicine? They need to know if you have any of these conditions: -glaucoma -hardening or blockages of the arteries or heart blood vessels -heart disease or a heart defect -high blood pressure -history of alcohol or drug abuse -history of stroke -over-active thyroid gland -psychotic illness, depressed mood, or suicidal thoughts -recent weight loss -seizure disorder -Tourette's syndrome -an unusual or allergic reaction to dextroamphetamine, other amphetamines, other medicines, foods, dyes, or preservatives -pregnant or trying to get pregnant -breast-feeding How should I use this medicine? Take this medicine by mouth with a glass of water. Follow the directions on the prescription label. Take your doses at regular intervals. Do not take your medicine more often than directed. Do not suddenly stop your medicine. You must gradually reduce the dose or you may feel withdrawal effects. Ask your doctor or health care professional for advice. Talk to your pediatrician regarding the use of this medicine in children. Special care may be needed. While this drug may be prescribed for children as young as 3 years for selected conditions, precautions do apply. Overdosage: If you think you have taken too much of this medicine contact a poison control center or emergency room at once. NOTE: This medicine is only for you. Do not share this medicine with  others. What if I miss a dose? If you miss a dose, take it as soon as you can. If it is almost time for your next dose, take only that dose. Do not take double or extra doses. What may interact with this medicine? Do not take this medicine with any of the following medications: -alcohol -certain migraine headache medicines like almotriptan, eletriptan, frovatriptan, naratriptan, rizatriptan, sumatriptan, zolmitriptan -lithium -medicines called MAO inhibitors like Nardil, Parnate, Marplan, Eldepryl -medicines to decrease appetite or cause weight loss -melatonin -meperidine -other stimulant medications like dexmethylphenidate, methylphenidate, modafinil -pimozide -procarbazine This medicine may also interact with the following medications: -acetazolamide -ammonium chloride -ascorbic acid -glutamic acid -medicines for colds, sinus, and breathing difficulties -medicines for depression, anxiety, or psychotic disturbances -medicines for high blood pressure and heart medicines -methenamine -norepinephrine -propoxyphene -seizure (convulsion) or epilepsy medicine -sodium acid phosphate -sodium bicarbonate This list may not describe all possible interactions. Give your health care provider a list of all the medicines, herbs, non-prescription drugs, or dietary supplements you use. Also tell them if you smoke, drink alcohol, or use illegal drugs. Some items may interact with your medicine. What should I watch for while using this medicine? Visit your doctor or health care professional for regular checks on your progress. This prescription requires that you follow special procedures with your doctor and pharmacy. You will need to have a new written prescription from your doctor every time you need a refill. This medicine may affect your concentration, or hide signs of tiredness. Until you know how this medicine affects you, do not drive, ride a bicycle, use machinery, or do anything that needs  mental alertness. Tell your doctor or health care professional if this medicine  loses its effects, or if you feel you need to take more than the prescribed amount. Do not change the dosage without talking to your doctor or health care professional. Decreased appetite is a common side effect when starting this medicine. Eating small, frequent meals or snacks can help. Talk to your doctor if you continue to have poor eating habits. Height and weight growth of a child taking this medicine will be monitored closely. If you are going to have surgery or will need an x-ray procedure that uses contrast agents, tell your doctor or health care professional that you are taking this medicine. What side effects may I notice from receiving this medicine? Side effects that you should report to your doctor or health care professional as soon as possible: -allergic reactions like skin rash, itching or hives, swelling of the face, lips, or tongue -anxiety, nervousness -changes in mood or behavior -chest pain -fast, irregular heartbeat -fever, or hot, dry skin -high blood pressure -muscle twitching -uncontrollable head, mouth, neck, arm, or leg movements Side effects that usually do not require medical attention (report to your doctor or health care professional if they continue or are bothersome): -difficulty sleeping -dizziness or light headedness -headache -nausea, vomiting -stomach cramps -weight loss This list may not describe all possible side effects. Call your doctor for medical advice about side effects. You may report side effects to FDA at 1-800-FDA-1088. Where should I keep my medicine? Keep out of the reach of children. This medicine can be abused. Keep your medicine in a safe place to protect it from theft. Do not share this medicine with anyone. Selling or giving away this medicine is dangerous and against the law. Store at room temperature between 15 and 30 degrees C (59 and 86 degrees F). Keep  container tightly closed. Throw away any unused medicine after the expiration date. NOTE: This sheet is a summary. It may not cover all possible information. If you have questions about this medicine, talk to your doctor, pharmacist, or health care provider.  2013, [employer]. (02/12/2008 6:22:42 PM)

## 2012-12-29 NOTE — Assessment & Plan Note (Signed)
Refill valtrex 

## 2012-12-29 NOTE — Assessment & Plan Note (Signed)
Refill meds F/u new pcp in 6 months

## 2012-12-29 NOTE — Progress Notes (Signed)
  Subjective:    Patient ID: Joy [name], female    DOB: 07/25/1976, 37 y.o.   MRN: [id number]   HPI Pt is here to f/u ADD.  No complaints. Pt has moved to [place] and is looking for a dr there.     Review of Systems As above    Objective:   Physical Exam BP 122/80  Pulse 71  Temp 98.6 F (37 C) (Oral)  Wt 195 lb 6.4 oz (88.633 kg)  SpO2 97% General appearance: alert, cooperative, appears stated age and no distress Neck: no adenopathy, supple, symmetrical, trachea midline and thyroid not enlarged, symmetric, no tenderness/mass/nodules Lungs: clear to auscultation bilaterally Heart: S1, S2 normal Extremities: extremities normal, atraumatic, no cyanosis or edema       Assessment & Plan:

## 2013-02-05 ENCOUNTER — Encounter: Payer: Self-pay | Admitting: Family Medicine

## 2013-08-20 DIAGNOSIS — M65331 Trigger finger, right middle finger: Secondary | ICD-10-CM

## 2013-08-20 HISTORY — DX: Trigger finger, right middle finger: M65.331

## 2013-08-21 ENCOUNTER — Other Ambulatory Visit: Payer: Self-pay | Admitting: Orthopedic Surgery

## 2013-08-24 ENCOUNTER — Other Ambulatory Visit: Payer: Self-pay | Admitting: Orthopedic Surgery

## 2013-08-28 ENCOUNTER — Encounter (HOSPITAL_BASED_OUTPATIENT_CLINIC_OR_DEPARTMENT_OTHER): Payer: Self-pay | Admitting: *Deleted

## 2013-09-03 NOTE — H&P (Signed)
  Joy [name] is an 37 y.o. female.   Chief Complaint: c/o chronic and progressive STS symptoms right long finger HPI: Her main problem today is a sore right long finger with triggering in the morning and a 10 degree flexion contracture of the PIP joint. We had a detailed discussion regarding this predicament. In my judgement she would benefit from release of the A-1 pulley and limited synovectomy of her right long finger.  This can be accomplished under local anesthesia and sedation.     Past Medical History  Diagnosis Date  . PONV (postoperative nausea and vomiting)   . Personal history of epidural hemorrhage 2001    fell from [facility]  . ADHD (attention deficit hyperactivity disorder)   . History of MRSA infection 2012    right long finger  . History of varicose veins   . Trigger middle finger of right hand 08/2013    Past Surgical History  Procedure Laterality Date  . Breast reduction surgery    . Carpal tunnel release Right 12/26/2009  . Varicose vein surgery Right 01/06/09  . Refractive surgery Bilateral   . Carpal tunnel release Left 03/02/2011  . Hematoma evacuation  2001    epidural  . Vulva surgery  03/2012    labiaplasty    Family History  Problem Relation Age of Onset  . Hypertension Father   . Diabetes Father   . Diabetes Maternal Aunt   . Anesthesia problems Sister     post-op N/V  . Anesthesia problems Mother     post-op N/V   Social History:  reports that she quit smoking about 10 years ago. She has never used smokeless tobacco. She reports that  drinks alcohol. She reports that she uses illicit drugs (Marijuana).  Allergies:  Allergies  Allergen Reactions  . Codeine Nausea And Vomiting  . Latex Itching and Rash    No prescriptions prior to admission    No results found for this or any previous visit (from the past 48 hour(s)).  No results found.   Pertinent items are noted in HPI.  Height 5\' 11"  (1.803 m), weight 83.008 kg (183 lb),  last menstrual period 08/27/2013.  General appearance: alert Head: Normocephalic, without obvious abnormality Neck: supple, symmetrical, trachea midline Resp: clear to auscultation bilaterally Cardio: regular rate and rhythm GI: normal findings: bowel sounds normal Extremities:  Examination of the right hand reveals active triggering of the right long finger at the A-1 pulley level. There is palpable tendon nodule present.  Neurovascularly she is intact. She has full motion of her other digits without problem.   Pulses: 2+ and symmetric Skin: normal Neurologic: Grossly normal    Assessment/Plan Impression: Right long finger chronic STS  Plan: To the OR for release A-1 pulley right long finger and exam of PIP joint.The procedure, risks,benefits and post-op course were discussed with the patient at length and they were in agreement with the plan.  DASNOIT,Jerzey Komperda J 09/03/2013, 12:55 PM   H&P documentation: 09/04/2013  -History and Physical Reviewed  -Patient has been re-examined  -No change in the plan of care  Wyn Forster, MD

## 2013-09-04 ENCOUNTER — Encounter (HOSPITAL_BASED_OUTPATIENT_CLINIC_OR_DEPARTMENT_OTHER): Payer: Self-pay | Admitting: *Deleted

## 2013-09-04 ENCOUNTER — Encounter (HOSPITAL_BASED_OUTPATIENT_CLINIC_OR_DEPARTMENT_OTHER)
Admission: RE | Disposition: A | Discharge status: Home / Self Care | Payer: Self-pay | Source: Ambulatory Visit | Attending: Orthopedic Surgery

## 2013-09-04 ENCOUNTER — Ambulatory Visit (HOSPITAL_BASED_OUTPATIENT_CLINIC_OR_DEPARTMENT_OTHER)
Admission: RE | Admit: 2013-09-04 | Discharge: 2013-09-04 | Disposition: A | Discharge status: Home / Self Care | Payer: BC Managed Care – PPO | Source: Ambulatory Visit | Attending: Orthopedic Surgery | Admitting: Orthopedic Surgery

## 2013-09-04 DIAGNOSIS — M65839 Other synovitis and tenosynovitis, unspecified forearm: Secondary | ICD-10-CM | POA: Insufficient documentation

## 2013-09-04 DIAGNOSIS — M6530 Trigger finger, unspecified finger: Secondary | ICD-10-CM | POA: Insufficient documentation

## 2013-09-04 HISTORY — DX: Trigger finger, right middle finger: M65.331

## 2013-09-04 HISTORY — DX: Other specified postprocedural states: R11.2

## 2013-09-04 HISTORY — DX: Other specified postprocedural states: Z98.890

## 2013-09-04 HISTORY — DX: Personal history of other diseases of the circulatory system: Z86.79

## 2013-09-04 HISTORY — PX: TRIGGER FINGER RELEASE: SHX641

## 2013-09-04 HISTORY — DX: Attention-deficit hyperactivity disorder, unspecified type: F90.9

## 2013-09-04 HISTORY — DX: Personal history of Methicillin resistant Staphylococcus aureus infection: Z86.14

## 2013-09-04 SURGERY — MINOR RELEASE TRIGGER FINGER/A-1 PULLEY
Anesthesia: LOCAL | Site: Hand | Laterality: Right | Wound class: Clean

## 2013-09-04 MED ORDER — CHLORHEXIDINE GLUCONATE 4 % EX LIQD: 60.0000 mL | Freq: Once | CUTANEOUS | Status: DC

## 2013-09-04 MED ORDER — LIDOCAINE HCL 2 % IJ SOLN
INTRAMUSCULAR | Status: DC | PRN
  Administered 2013-09-04: 2 mL

## 2013-09-04 SURGICAL SUPPLY — 37 items
BANDAGE ADHESIVE 1X3 (GAUZE/BANDAGES/DRESSINGS) IMPLANT
BANDAGE COBAN STERILE 2 (GAUZE/BANDAGES/DRESSINGS) ×2 IMPLANT
BLADE SURG 15 STRL LF DISP TIS (BLADE) ×1 IMPLANT
BLADE SURG 15 STRL SS (BLADE) ×2
BNDG CMPR 9X4 STRL LF SNTH (GAUZE/BANDAGES/DRESSINGS)
BNDG ESMARK 4X9 LF (GAUZE/BANDAGES/DRESSINGS) IMPLANT
BRUSH SCRUB EZ PLAIN DRY (MISCELLANEOUS) ×2 IMPLANT
CLOTH BEACON ORANGE TIMEOUT ST (SAFETY) ×2 IMPLANT
CORDS BIPOLAR (ELECTRODE) IMPLANT
COVER MAYO STAND STRL (DRAPES) ×2 IMPLANT
COVER TABLE BACK 60X90 (DRAPES) IMPLANT
CUFF TOURNIQUET SINGLE 18IN (TOURNIQUET CUFF) IMPLANT
DECANTER SPIKE VIAL GLASS SM (MISCELLANEOUS) IMPLANT
DRAPE SURG 17X23 STRL (DRAPES) ×2 IMPLANT
GAUZE SPONGE 4X4 12PLY STRL LF (GAUZE/BANDAGES/DRESSINGS) ×4 IMPLANT
GLOVE BIOGEL M STRL SZ7.5 (GLOVE) ×1 IMPLANT
GLOVE EPREMIER NITRL EXT CFF L (GLOVE) IMPLANT
GLOVE EXAM NITRILE EXT CFF LRG (GLOVE) ×2 IMPLANT
GLOVE ORTHO TXT STRL SZ7.5 (GLOVE) ×1 IMPLANT
GLOVE SURG SS PI 6.5 STRL IVOR (GLOVE) ×1 IMPLANT
GLOVE SURG SS PI 7.5 STRL IVOR (GLOVE) ×1 IMPLANT
GOWN BRE IMP PREV XXLGXLNG (GOWN DISPOSABLE) ×2 IMPLANT
GOWN PREVENTION PLUS XLARGE (GOWN DISPOSABLE) ×2 IMPLANT
NEEDLE 27GAX1X1/2 (NEEDLE) ×2 IMPLANT
PACK BASIN DAY SURGERY FS (CUSTOM PROCEDURE TRAY) IMPLANT
PADDING CAST ABS 4INX4YD NS (CAST SUPPLIES) ×1
PADDING CAST ABS COTTON 4X4 ST (CAST SUPPLIES) ×1 IMPLANT
SPONGE GAUZE 4X4 12PLY (GAUZE/BANDAGES/DRESSINGS) ×2 IMPLANT
STOCKINETTE 4X48 STRL (DRAPES) ×2 IMPLANT
STRIP CLOSURE SKIN 1/2X4 (GAUZE/BANDAGES/DRESSINGS) ×2 IMPLANT
SUT PROLENE 3 0 PS 2 (SUTURE) ×2 IMPLANT
SUT PROLENE 4 0 P 3 18 (SUTURE) IMPLANT
SYR 3ML 23GX1 SAFETY (SYRINGE) IMPLANT
SYR CONTROL 10ML LL (SYRINGE) ×2 IMPLANT
TOWEL OR 17X24 6PK STRL BLUE (TOWEL DISPOSABLE) ×3 IMPLANT
TRAY DSU PREP LF (CUSTOM PROCEDURE TRAY) ×2 IMPLANT
UNDERPAD 30X30 INCONTINENT (UNDERPADS AND DIAPERS) ×2 IMPLANT

## 2013-09-04 NOTE — Brief Op Note (Signed)
Joy [name] is an 37 y.o. female.   Chief Complaint: locking trigger finger right long YQM:VHQIONG right long trigger finger, not improved after steroid injection  Past Medical History  Diagnosis Date  . PONV (postoperative nausea and vomiting)   . Personal history of epidural hemorrhage 2001    fell from [facility]  . ADHD (attention deficit hyperactivity disorder)   . History of MRSA infection 2012    right long finger  . History of varicose veins   . Trigger middle finger of right hand 08/2013    Past Surgical History  Procedure Laterality Date  . Breast reduction surgery    . Carpal tunnel release Right 12/26/2009  . Varicose vein surgery Right 01/06/09  . Refractive surgery Bilateral   . Carpal tunnel release Left 03/02/2011  . Hematoma evacuation  2001    epidural  . Vulva surgery  03/2012    labiaplasty    Family History  Problem Relation Age of Onset  . Hypertension Father   . Diabetes Father   . Diabetes Maternal Aunt   . Anesthesia problems Sister     post-op N/[name]  . Anesthesia problems Mother     post-op N/[name]   Social History:  reports that she quit smoking about 10 years ago. She has never used smokeless tobacco. She reports that  drinks alcohol. She reports that she uses illicit drugs (Marijuana).  Allergies:  Allergies  Allergen Reactions  . Codeine Nausea And Vomiting  . Latex Itching and Rash    Medications Prior to Admission  Medication Sig Dispense Refill  . acyclovir (ZOVIRAX) 200 MG capsule Take 2 capsules by mouth each day for herpes prevention  180 capsule  1  . amphetamine-dextroamphetamine (ADDERALL) 20 MG tablet Take 1 tablet (20 mg total) by mouth 2 (two) times daily.  60 tablet  0  . cetirizine (ZYRTEC) 10 MG tablet Take 10 mg by mouth daily.      . folic acid (FOLVITE) 1 MG tablet Take 1 mg by mouth daily.      . Multiple Vitamin (MULTIVITAMIN) tablet Take 1 tablet by mouth daily.          No results found for this or any  previous visit (from the past 48 hour(s)).  No results found.   A comprehensive review of systems was negative.  Blood pressure 131/82, pulse 83, temperature 97.8 F (36.6 C), temperature source Oral, resp. rate 20, height 5\' 11"  (1.803 m), weight 83.008 kg (183 lb), last menstrual period 08/27/2013, SpO2 98.00%.  General appearance: alert Head: atraumatic Neck: no carotid bruit, no JVD, supple, symmetrical, trachea midline and thyroid not enlarged, symmetric, no tenderness/mass/nodules Resp: clear to auscultation bilaterally Cardio: regular rate and rhythm, S1, S2 normal, no murmur, click, rub or gallop GI: soft, non-tender; bowel sounds normal; no masses,  no organomegaly Extremities: extremities normal, atraumatic, no cyanosis or edema Pulses: 2+ and symmetric Skin: Skin color, texture, turgor normal. No rashes or lesions Neurologic: Grossly normal Incision/Wound: Locking right long trigger finger  Assessment/Plan Chronic locking right long trigger finger.  Release of right long trigger finger.  Local anesthesia pllanned.  Joy [name],Joy [name] 09/04/2013, 11:31 AM  09/04/2013  11:31 AM  PATIENT:  Joy [name]  37 y.o. female  PRE-OPERATIVE DIAGNOSIS:  STS RIGHT LONG FINGER PIP CONTRACTURE  POST-OPERATIVE DIAGNOSIS:  stenosing tenosynovitis right long finger   PROCEDURE:  Procedure(s): RIGHT LONG PIP EVALUATE RELEASE TRIGGER FINGER/A-1 PULLEY RIGHT LONG FINGER  SURGEON:  Surgeon(s): Joy [name]  Joy Hageman., MD  PHYSICIAN ASSISTANT:   ASSISTANTS: surgical tech  ANESTHESIA:   local  EBL:     DRAINS: none   LOCAL MEDICATIONS USED:  LIDOCAINE   SPECIMEN:  No Specimen  DISPOSITION OF SPECIMEN:  N/A  COUNTS:  YES  TOURNIQUET:  * Missing tourniquet times found for documented tourniquets in log:  [id number] *  DICTATION: .Other Dictation: Dictation Number [id number]  PLAN OF CARE: Discharge to home after PACU    Delay start of Pharmacological VTE agent (>24hrs) due  to surgical blood loss or risk of bleeding:  not applicable              Joy [name] is a 37 y.o. female patient.  No diagnosis found. Past Medical History  Diagnosis Date  . PONV (postoperative nausea and vomiting)   . Personal history of epidural hemorrhage 2001    fell from [facility]  . ADHD (attention deficit hyperactivity disorder)   . History of MRSA infection 2012    right long finger  . History of varicose veins   . Trigger middle finger of right hand 08/2013   No past surgical history pertinent negatives on file. Scheduled Meds: . chlorhexidine  60 mL Topical Once  . chlorhexidine  60 mL Topical Once   Continuous Infusions:  PRN Meds:lidocaine  Allergies  Allergen Reactions  . Codeine Nausea And Vomiting  . Latex Itching and Rash   Active Problems:   * No active hospital problems. *  Blood pressure 131/82, pulse 83, temperature 97.8 F (36.6 C), temperature source Oral, resp. rate 20, height 5\' 11"  (1.803 m), weight 83.008 kg (183 lb), last menstrual period 08/27/2013, SpO2 98.00%.  @IPPOSUB @ @IPPOOBJ @ @IPPOAP @  Joy [name],Joy [name] 09/04/2013

## 2013-09-04 NOTE — Discharge Instructions (Signed)

## 2013-09-05 ENCOUNTER — Encounter (HOSPITAL_BASED_OUTPATIENT_CLINIC_OR_DEPARTMENT_OTHER): Payer: Self-pay | Admitting: Orthopedic Surgery

## 2013-09-05 NOTE — Op Note (Signed)
Joy [name], Joy [name]                ACCOUNT NO.:  [id number]  MEDICAL RECORD NO.:  [id number]  LOCATION:                               FACILITY:  [facility]  PHYSICIAN:  Katy Fitch. Jalyssa Fleisher, M.D. 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  OPERATION:  Release of right long finger A1 pulley with limited synovectomy.  OPERATING SURGEON:  Katy Fitch. Salam Micucci, M.D.  ASSISTANT:  Surgical technician.  ANESTHESIA:  2% lidocaine flexor sheath block and palmar block right long finger.  This was performed as a minor operating room procedure.  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  She has been a long-term patient. She returns for release of right long finger A1 pulley due to failure to respond to steroid injection and activity modification.  We had a detailed informed consent.  She preferred to have the surgery performed under straight local anesthesia.  Questions regarding the anticipated procedure were invited and answered in detail.  PROCEDURE:  Analuisa Tudor was interviewed in the holding area and a proper surgical site identified per protocol with a marking pen.  We discussed straight local anesthesia in a minor operating setting. Questions were invited and answered in detail.  She was brought to room 2 of the [facility] where she was placed in a supine position upon the operating table.  Following Betadine prep of her palm and informed consent, we infiltrated approximately 4 mL of 2% lidocaine into the flexor sheath and in the skin at the site of the anticipated  incision.  After 5 minutes, excellent anesthesia was achieved.  Her right hand and arm were then prepped with Betadine soap and solution, sterilely draped.  An Esmarch bandage was used to exsanguinate the hand, wrist, and forearm.  The Esmarch was left on the proximal forearm as a tourniquet.  Following routine surgical time-out, Nysa was able to hold her hand flat while we simply made an incision in the distal palmar crease. Subcutaneous tissues were carefully divided revealing hypertrophy of the palmar fascia.  The pretendinous fibers of the palmar fascia were released with scissors followed by identification of the A1 pulley.  The A1 pulley was split with scissors.  The tendons delivered and a thick cuff of tenosynovium removed with scissors from the flexor digitorum superficialis and profundus tendons.  Thereafter, Tyrina demonstrated full active range of motion of the finger.  The wound was then repaired with intradermal 4-0 Prolene and Steri-Strips.  A compressive dressing was applied with sterile gauze and Coban.  For aftercare, she will keep her dressing dry.  We will see her back for followup in our office in 1 week for suture removal.  She may use her hand as tolerated.  Questions were invited and answered in detail.     Katy Fitch Mieke Brinley, M.D.   ______________________________ Katy Fitch.  Ambri Miltner, M.D.    RVS/MEDQ  D:  09/04/2013  T:  09/04/2013  Job:  [id number]

## 2014-02-27 ENCOUNTER — Other Ambulatory Visit: Payer: Self-pay | Admitting: Orthopedic Surgery

## 2014-03-01 NOTE — Pre-Procedure Instructions (Signed)
Pt. states procedure to be done under local anesthesia only.

## 2014-03-06 NOTE — H&P (Addendum)
  Joy [name] returns for follow up evaluation of her right long finger A-1 pulley release performed on 09-04-13. She has very satisfactory healing of her wound. She has full ROM and no pain. One can feel the irregularity of her flexor tendon with full flexion but there is no residual triggering.   She still has painful first dorsal compartment STS on the left. She has a small cyst forming over the compartment. Examination of the left wrist reveals mild swelling over the left first dorsal compartment.  Neurovascularly she is intact.  She has excellent motion of her thumb IP joint without pain. She does have a positive Finkelstein and resisted metacarpal extension.    This has been chronic for more than 6 months. I have recommended that she proceed with release of her left first dorsal compartment under local anesthesia in a minor OR setting. She tolerated the trigger finger release quite well under local. Questions were invited and answered in detail.   H&P documentation: 03/07/2014  -History and Physical Reviewed  -Patient has been re-examined  -No change in the plan of care  Wyn Forsterobert V Dejanae Helser Jr, MD

## 2014-03-07 ENCOUNTER — Ambulatory Visit (HOSPITAL_BASED_OUTPATIENT_CLINIC_OR_DEPARTMENT_OTHER)
Admission: RE | Admit: 2014-03-07 | Discharge: 2014-03-07 | Disposition: A | Discharge status: Home / Self Care | Payer: BC Managed Care – PPO | Source: Ambulatory Visit | Attending: Orthopedic Surgery | Admitting: Orthopedic Surgery

## 2014-03-07 ENCOUNTER — Encounter (HOSPITAL_BASED_OUTPATIENT_CLINIC_OR_DEPARTMENT_OTHER): Payer: Self-pay | Admitting: *Deleted

## 2014-03-07 ENCOUNTER — Encounter (HOSPITAL_BASED_OUTPATIENT_CLINIC_OR_DEPARTMENT_OTHER)
Admission: RE | Disposition: A | Discharge status: Home / Self Care | Payer: Self-pay | Source: Ambulatory Visit | Attending: Orthopedic Surgery

## 2014-03-07 DIAGNOSIS — M654 Radial styloid tenosynovitis [de Quervain]: Secondary | ICD-10-CM | POA: Insufficient documentation

## 2014-03-07 HISTORY — PX: MINOR RELEASE DORSAL COMPARTMENT (DEQUERVAINS): SHX5980

## 2014-03-07 SURGERY — MINOR RELEASE DORSAL COMPARTMENT (DEQUERVAINS)
Anesthesia: LOCAL | Site: Wrist | Laterality: Left

## 2014-03-07 MED ORDER — PROMETHAZINE HCL 6.25 MG/5ML PO SYRP: 12.5000 mg | ORAL_SOLUTION | Freq: Four times a day (QID) | ORAL | Status: AC | PRN

## 2014-03-07 MED ORDER — HYDROMORPHONE HCL 2 MG PO TABS: 2.0000 mg | ORAL_TABLET | ORAL | Status: AC | PRN

## 2014-03-07 MED ORDER — LIDOCAINE HCL 2 % IJ SOLN
INTRAMUSCULAR | Status: AC
  Filled 2014-03-07: qty 20

## 2014-03-07 MED ORDER — LIDOCAINE HCL 2 % IJ SOLN
INTRAMUSCULAR | Status: DC | PRN
  Administered 2014-03-07: 2 mL

## 2014-03-07 SURGICAL SUPPLY — 40 items
BANDAGE COBAN STERILE 2 (GAUZE/BANDAGES/DRESSINGS) ×1 IMPLANT
BANDAGE ELASTIC 3 VELCRO ST LF (GAUZE/BANDAGES/DRESSINGS) ×1 IMPLANT
BLADE MINI RND TIP GREEN BEAV (BLADE) IMPLANT
BLADE SURG 15 STRL LF DISP TIS (BLADE) ×1 IMPLANT
BLADE SURG 15 STRL SS (BLADE) ×2
BNDG COHESIVE 3X5 TAN STRL LF (GAUZE/BANDAGES/DRESSINGS) IMPLANT
BRUSH SCRUB EZ PLAIN DRY (MISCELLANEOUS) ×2 IMPLANT
CORDS BIPOLAR (ELECTRODE) IMPLANT
COVER MAYO STAND STRL (DRAPES) ×2 IMPLANT
COVER TABLE BACK 60X90 (DRAPES) ×1 IMPLANT
CUFF TOURNIQUET SINGLE 18IN (TOURNIQUET CUFF) ×1 IMPLANT
DECANTER SPIKE VIAL GLASS SM (MISCELLANEOUS) IMPLANT
DRAPE EXTREMITY T 121X128X90 (DRAPE) ×1 IMPLANT
DRAPE SURG 17X23 STRL (DRAPES) ×2 IMPLANT
DRSG TEGADERM 4X4.75 (GAUZE/BANDAGES/DRESSINGS) ×1 IMPLANT
GLOVE BIOGEL M STRL SZ7.5 (GLOVE) ×1 IMPLANT
GLOVE ORTHO TXT STRL SZ7.5 (GLOVE) ×2 IMPLANT
GLOVE SURG SS PI 6.5 STRL IVOR (GLOVE) ×2 IMPLANT
GLOVE SURG SS PI 7.5 STRL IVOR (GLOVE) ×1 IMPLANT
GOWN STRL REUS W/ TWL LRG LVL3 (GOWN DISPOSABLE) ×1 IMPLANT
GOWN STRL REUS W/ TWL XL LVL3 (GOWN DISPOSABLE) ×2 IMPLANT
GOWN STRL REUS W/TWL LRG LVL3 (GOWN DISPOSABLE) ×2
GOWN STRL REUS W/TWL XL LVL3 (GOWN DISPOSABLE) ×4
NEEDLE 27GAX1X1/2 (NEEDLE) ×2 IMPLANT
PACK BASIN DAY SURGERY FS (CUSTOM PROCEDURE TRAY) ×2 IMPLANT
PAD CAST 3X4 CTTN HI CHSV (CAST SUPPLIES) IMPLANT
PADDING CAST ABS 4INX4YD NS (CAST SUPPLIES) ×1
PADDING CAST ABS COTTON 4X4 ST (CAST SUPPLIES) ×1 IMPLANT
PADDING CAST COTTON 3X4 STRL (CAST SUPPLIES)
SPONGE GAUZE 4X4 12PLY (GAUZE/BANDAGES/DRESSINGS) ×2 IMPLANT
STOCKINETTE 4X48 STRL (DRAPES) ×2 IMPLANT
STRIP CLOSURE SKIN 1/2X4 (GAUZE/BANDAGES/DRESSINGS) ×2 IMPLANT
SUT PROLENE 3 0 PS 2 (SUTURE) ×1 IMPLANT
SUT PROLENE 4 0 P 3 18 (SUTURE) ×1 IMPLANT
SUT VIC AB 4-0 P-3 18XBRD (SUTURE) IMPLANT
SUT VIC AB 4-0 P3 18 (SUTURE)
SYR 3ML 23GX1 SAFETY (SYRINGE) IMPLANT
SYR CONTROL 10ML LL (SYRINGE) ×2 IMPLANT
TRAY DSU PREP LF (CUSTOM PROCEDURE TRAY) ×2 IMPLANT
UNDERPAD 30X30 INCONTINENT (UNDERPADS AND DIAPERS) ×2 IMPLANT

## 2014-03-07 NOTE — Brief Op Note (Signed)
03/07/2014  12:34 PM  PATIENT:  Joy [name]  38 y.o. female  PRE-OPERATIVE DIAGNOSIS:  LEFT 1ST DORSAL STENOSING TENOSYNOVITIS  POST-OPERATIVE DIAGNOSIS:  Left First Dorsal Stenosing Tenosynovitis  PROCEDURE:  Procedure(s): MINOR LEFT 1ST DORSAL COMPARTMENT RELEASE (Left)  SURGEON:  Surgeon(s) and Role:    * Wyn Forsterobert V Graylee Arutyunyan Jr., MD - Primary  PHYSICIAN ASSISTANT:   ASSISTANTS: surgical tech   ANESTHESIA:   general  EBL:     BLOOD ADMINISTERED:none  DRAINS: none   LOCAL MEDICATIONS USED:  XYLOCAINE   SPECIMEN:  No Specimen  DISPOSITION OF SPECIMEN:  N/A  COUNTS:  YES  TOURNIQUET:   Total Tourniquet Time Documented: Upper Arm (Left) - 9 minutes Total: Upper Arm (Left) - 9 minutes   DICTATION: .Other Dictation: Dictation Number [id number]  PLAN OF CARE: Discharge to home after PACU  PATIENT DISPOSITION:  PACU - hemodynamically stable.   Delay start of Pharmacological VTE agent (>24hrs) due to surgical blood loss or risk of bleeding: not applicable

## 2014-03-07 NOTE — Discharge Instructions (Signed)

## 2014-03-07 NOTE — Op Note (Signed)
[id number]  

## 2014-03-08 ENCOUNTER — Encounter (HOSPITAL_BASED_OUTPATIENT_CLINIC_OR_DEPARTMENT_OTHER): Payer: Self-pay | Admitting: Orthopedic Surgery

## 2014-03-08 NOTE — Op Note (Signed)
[name], [name]                ACCOUNT NO.:  [id number]  MEDICAL RECORD NO.:  [id number]  LOCATION:                                 FACILITY:  PHYSICIAN:  Katy Fitch. Torra Pala, M.D. DATE OF BIRTH:  [birth year]  DATE OF PROCEDURE:  03/07/2014 DATE OF DISCHARGE:  03/07/2014                              OPERATIVE REPORT   PREOPERATIVE DIAGNOSIS:  Chronic stenosing tenosynovitis, left first dorsal compartment.  POSTOPERATIVE DIAGNOSIS:  Chronic stenosing tenosynovitis, left first dorsal compartment.  OPERATION:  Release of left first dorsal compartment.  OPERATING SURGEON:  Katy Fitch. Jessa Stinson, MD  ASSISTANT:  Surgical technician.  ANESTHESIA:  2% lidocaine and field block.  This was performed as a minor operating room procedure.  ANESTHETIST:  Katy Fitch. 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  Clinical examination revealed signs of chronic stenosing tenosynovitis of left first dorsal compartment.  I previously treated her for stenosing tenosynovitis of her finger of the right hand.  She has failed nonoperative measures for more than 3 months.  We recommended proceeding with release of the first dorsal compartment at this time.  Preoperatively, she was reminded of potential risks and benefits of surgery.  Procedure aftercare risks and benefits were detailed. Questions were invited and answered in the holding area and her left wrist marked as a proper surgical site per protocol with a marking pen.  She was transferred to room 2 of the [facility] where after informed consent and Betadine prep, 3 mL of 2% lidocaine were infiltrated in the path of the intended incision into the first dorsal compartment tendon sheath.  After few moments, excellent anesthesia was achieved.  The left hand and arm were then prepped with  Betadine soap and solution, sterilely draped. A pneumatic tourniquet was applied to the proximal left brachium. Following exsanguination of left arm with Esmarch bandage, the arterial tourniquet was inflated to 220 mmHg.  Following routine surgical time-out, procedure commenced with a short oblique 15-mm incision directly over the apex of the compartment. Subcutaneous tissues were gently divided and carried gently to retract the radial superficial sensory branches.  The compartment was isolated, split with scalpel and scissors.  There was a large caliber of extensor pollicis brevis noted.  There was a thick septum measuring more than 3 mm with separating the abductor pollicis longus tendon slips.  Second incision was fashioned directly over the abductor pollicis longus tendon slips.  The compartment was exposed and the tendons retracted.  The septum was resected with a rongeur.  Hemostasis was not problematic.  Tendon was then replaced with unroofed compartment followed by repair of the skin with intradermal 3-0 Prolene and a Steri-Strip.  [name] was placed in compressive dressing with a Coban wrap.  She was discharged home to self-care with a prescription for Dilaudid 2 mg 1 p.o. q.4-6 hours p.r.n. pain, 20 tablets without refill.  Also, she was provided a prescription for Phenergan 25 mg 1 p.o. q.6 hours p.r.n. nausea.     Katy Fitch Pietra Zuluaga, M.D.  ______________________________ Katy Fitchobert V. Diamonds Lippard, M.D.    RVS/MEDQ  D:  03/07/2014  T:  03/07/2014  Job:  [id number]

## 2014-03-09 NOTE — H&P (Signed)
Joy [name] is an 38 y.o. female.   Chief Complaint: Pain left wrist. 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  Not responsive to 3 months of activity modification and splinting.  Past Medical History  Diagnosis Date  . PONV (postoperative nausea and vomiting)   . Personal history of epidural hemorrhage 2001    fell from [employer]  . ADHD (attention deficit hyperactivity disorder)   . History of MRSA infection 2012    right long finger  . History of varicose veins   . Trigger middle finger of right hand 08/2013    Past Surgical History  Procedure Laterality Date  . Breast reduction surgery    . Carpal tunnel release Right 12/26/2009  . Varicose vein surgery Right 01/06/09  . Refractive surgery Bilateral   . Carpal tunnel release Left 03/02/2011  . Hematoma evacuation  2001    epidural  . Vulva surgery  03/2012    labiaplasty  . Trigger finger release Right 09/04/2013    Procedure: RIGHT LONG PIP EVALUATE RELEASE TRIGGER FINGER/A-1 PULLEY RIGHT LONG FINGER;  Surgeon: Wyn Forster., MD;  Location: [facility];  Service: Orthopedics;  Laterality: Right;  . Minor release dorsal compartment (dequervains) Left 03/07/2014    Procedure: MINOR LEFT 1ST DORSAL COMPARTMENT RELEASE;  Surgeon: Wyn Forster., MD;  Location: [facility];  Service: Orthopedics;  Laterality: Left;    Family History  Problem Relation Age of Onset  . Hypertension Father   . Diabetes Father   . Diabetes Maternal Aunt   . Anesthesia problems Sister     post-op N/V  . Anesthesia problems Mother     post-op N/V   Social History:  reports that she quit smoking about 11 years ago. She has never used smokeless tobacco. She reports that she drinks alcohol. She reports that she uses illicit drugs (Marijuana).  Allergies:  Allergies  Allergen Reactions  . Codeine Nausea And Vomiting  . Latex Itching and Rash  . Tramadol Nausea Only     No prescriptions prior to admission    No results found for this or any previous visit (from the past 48 hour(s)). No results found.  Review of Systems  Constitutional: Negative.   HENT: Negative.   Eyes: Negative.   Respiratory: Negative.   Cardiovascular: Negative.   Gastrointestinal: Negative.   Genitourinary: Negative.   Musculoskeletal:       Hx of stenosing tenosynovitis  Skin: Negative.   Neurological: Negative.   Endo/Heme/Allergies: Negative.   Psychiatric/Behavioral: Negative.     Blood pressure 147/93, pulse 71, temperature 98.7 F (37.1 C), temperature source Oral, resp. rate 20, height 5\' 11"  (1.803 m), weight 81.647 kg (180 lb), last menstrual period 03/05/2014, SpO2 100.00%. Physical Exam  Constitutional: She is oriented to person, place, and time. She appears well-developed and well-nourished.  HENT:  Head: Normocephalic and atraumatic.  Eyes: Conjunctivae and EOM are normal. Pupils are equal, round, and reactive to light.  Neck: Normal range of motion. Neck supple.  Cardiovascular: Normal rate and regular rhythm.   Respiratory: Effort normal.  GI: Soft. Bowel sounds are normal.  Musculoskeletal: Normal range of motion.  Swelling of left first dorsal compartment. Positive Finklestein's manuver.  Neurological: She is alert and oriented to person, place, and time.  Skin: Skin is warm and dry.  Psychiatric: She has a normal mood and affect. Her behavior is normal. Judgment and thought content normal.  Assessment/Plan Chronic STS of left firs dorsal compartment.  Release of left first dorsal compartment under local anesthesia.  Mikena Masoner JR,Axtyn Woehler V 03/09/2014, 8:24 AM
